# Patient Record
Sex: Female | Born: 1985 | Race: Black or African American | Hispanic: No | Marital: Single | Smoking: Current every day smoker
Health system: Southern US, Community
[De-identification: ages and names within clinical notes are randomized; demographics above are authoritative.]

## PROBLEM LIST (undated history)

## (undated) DIAGNOSIS — R011 Cardiac murmur, unspecified: Secondary | ICD-10-CM

---

## 2017-11-23 ENCOUNTER — Other Ambulatory Visit: Payer: Self-pay

## 2017-11-23 ENCOUNTER — Emergency Department (HOSPITAL_BASED_OUTPATIENT_CLINIC_OR_DEPARTMENT_OTHER): Payer: Medicaid Other

## 2017-11-23 ENCOUNTER — Emergency Department (HOSPITAL_BASED_OUTPATIENT_CLINIC_OR_DEPARTMENT_OTHER)
Admission: EM | Admit: 2017-11-23 | Discharge: 2017-11-23 | Disposition: A | Discharge status: Home / Self Care | Payer: Medicaid Other | Attending: Emergency Medicine | Admitting: Emergency Medicine

## 2017-11-23 ENCOUNTER — Encounter (HOSPITAL_BASED_OUTPATIENT_CLINIC_OR_DEPARTMENT_OTHER): Payer: Self-pay | Admitting: *Deleted

## 2017-11-23 DIAGNOSIS — Z79899 Other long term (current) drug therapy: Secondary | ICD-10-CM | POA: Diagnosis not present

## 2017-11-23 DIAGNOSIS — M25552 Pain in left hip: Secondary | ICD-10-CM | POA: Insufficient documentation

## 2017-11-23 DIAGNOSIS — M791 Myalgia, unspecified site: Secondary | ICD-10-CM | POA: Diagnosis not present

## 2017-11-23 DIAGNOSIS — F17210 Nicotine dependence, cigarettes, uncomplicated: Secondary | ICD-10-CM | POA: Insufficient documentation

## 2017-11-23 LAB — PREGNANCY, URINE: PREG TEST UR: NEGATIVE

## 2017-11-23 MED ORDER — METHOCARBAMOL 500 MG PO TABS: 500.0000 mg | ORAL_TABLET | Freq: Two times a day (BID) | ORAL | 0 refills | Status: DC

## 2017-11-23 MED ORDER — ACETAMINOPHEN 500 MG PO TABS
1000.0000 mg | ORAL_TABLET | Freq: Once | ORAL | Status: AC
  Administered 2017-11-23: 1000 mg via ORAL
  Filled 2017-11-23: qty 2

## 2017-11-23 NOTE — ED Provider Notes (Signed)
[facility] EMERGENCY DEPARTMENT Provider Note   CSN: [id number] Arrival date & time: 11/23/17  0154     History   Chief Complaint Chief Complaint  Patient presents with  . Hip Pain    HPI Sabrina Haynes is a 32 y.o. female.  The history is provided by the patient.  Hip Pain  This is a new problem. The current episode started 12 to 24 hours ago. The problem occurs constantly. The problem has not changed since onset.Pertinent negatives include no chest pain, no abdominal pain, no headaches and no shortness of breath. Nothing aggravates the symptoms. Nothing relieves the symptoms. She has tried nothing for the symptoms. The treatment provided no relief.  Pain in left hip and every muscle of the left side of the body since getting pulled by her small but fast moving dog.    History reviewed. No pertinent past medical history.  There are no active problems to display for this patient.   History reviewed. No pertinent surgical history.  OB History   None      Home Medications    Prior to Admission medications   Medication Sig Start Date End Date Taking? Authorizing Provider  methocarbamol (ROBAXIN) 500 MG tablet Take 1 tablet (500 mg total) by mouth 2 (two) times daily. 11/23/17   Doral Ventrella, MD    Family History History reviewed. No pertinent family history.  Social History Social History   Tobacco Use  . Smoking status: Current Every Day Smoker    Packs/day: 0.50    Types: Cigarettes  Substance Use Topics  . Alcohol use: Yes    Comment: socially  . Drug use: Yes    Frequency: 7.0 times per week    Types: Marijuana     Allergies   Latex   Review of Systems Review of Systems  Constitutional: Negative for fever.  Respiratory: Negative for shortness of breath.   Cardiovascular: Negative for chest pain.  Gastrointestinal: Negative for abdominal pain.  Musculoskeletal: Positive for arthralgias. Negative for gait problem, joint swelling and  neck stiffness.  Neurological: Negative for weakness, numbness and headaches.  All other systems reviewed and are negative.    Physical Exam Updated Vital Signs BP (!) 138/95 (BP Location: Right Arm)   Pulse 77   Temp 98.1 F (36.7 C) (Oral)   Resp 18   Ht 5\' 5"  (1.651 m)   Wt 56.7 kg (125 lb)   SpO2 99%   BMI 20.80 kg/m   Physical Exam  Constitutional: She is oriented to person, place, and time. She appears well-developed and well-nourished. No distress.  HENT:  Head: Normocephalic and atraumatic.  Nose: Nose normal.  Mouth/Throat: No oropharyngeal exudate.  Eyes: Pupils are equal, round, and reactive to light. Conjunctivae are normal.  Neck: Normal range of motion. Neck supple.  Cardiovascular: Normal rate, regular rhythm, normal heart sounds and intact distal pulses.  Pulmonary/Chest: Effort normal and breath sounds normal.  Abdominal: Soft. Bowel sounds are normal. There is no tenderness. There is no guarding.  Musculoskeletal: She exhibits no edema or deformity.       Left shoulder: Normal.       Left elbow: Normal.       Left wrist: Normal.       Left hip: Normal.       Left knee: Normal.       Left ankle: Normal. Achilles tendon normal.       Cervical back: Normal.       Thoracic  back: Normal.       Lumbar back: Normal.       Left hand: Normal. She exhibits normal capillary refill. Normal sensation noted. Normal strength noted.  Neurological: She is alert and oriented to person, place, and time. She displays normal reflexes.  Skin: Skin is warm and dry. Capillary refill takes less than 2 seconds.  Psychiatric: She has a normal mood and affect.     ED Treatments / Results  Labs (all labs ordered are listed, but only abnormal results are displayed)  Results for orders placed or performed during the hospital encounter of 11/23/17  Pregnancy, urine  Result Value Ref Range   Preg Test, Ur NEGATIVE NEGATIVE   Dg Hip Unilat W Or Wo Pelvis 2-3 Views  Left  Result Date: 11/23/2017 CLINICAL DATA:  Left leg pain after dog jerked patient while on a walk. EXAM: DG HIP (WITH OR WITHOUT PELVIS) 2-3V LEFT COMPARISON:  None. FINDINGS: There is no evidence of hip fracture or dislocation. There is no evidence of arthropathy or other focal bone abnormality. Intrauterine device noted in the mid pelvis. Vascular calcifications consistent with phleboliths are present bilaterally. IMPRESSION: No acute osseous abnormality of the pelvis and left hip. Electronically Signed   By: Tollie Ethavid  Kwon M.D.   On: 11/23/2017 03:29    Radiology Dg Hip Unilat W Or Wo Pelvis 2-3 Views Left  Result Date: 11/23/2017 CLINICAL DATA:  Left leg pain after dog jerked patient while on a walk. EXAM: DG HIP (WITH OR WITHOUT PELVIS) 2-3V LEFT COMPARISON:  None. FINDINGS: There is no evidence of hip fracture or dislocation. There is no evidence of arthropathy or other focal bone abnormality. Intrauterine device noted in the mid pelvis. Vascular calcifications consistent with phleboliths are present bilaterally. IMPRESSION: No acute osseous abnormality of the pelvis and left hip. Electronically Signed   By: Tollie Ethavid  Kwon M.D.   On: 11/23/2017 03:29    Procedures Procedures (including critical care time)  Medications Ordered in ED Medications  acetaminophen (TYLENOL) tablet 1,000 mg (1,000 mg Oral Given 11/23/17 0255)       Final Clinical Impressions(s) / ED Diagnoses   Final diagnoses:  Left hip pain  Muscle pain   Return for weakness, numbness, changes in vision or speech, fevers >100.4 unrelieved by medication, shortness of breath, intractable vomiting, or diarrhea, abdominal pain, Inability to tolerate liquids or food, cough, altered mental status or any concerns. No signs of systemic illness or infection. The patient is nontoxic-appearing on exam and vital signs are within normal limits.   I have reviewed the triage vital signs and the nursing notes. Pertinent labs  &imaging results that were available during my care of the patient were reviewed by me and considered in my medical decision making (see chart for details).  After history, exam, and medical workup I feel the patient has been appropriately medically screened and is safe for discharge home. Pertinent diagnoses were discussed with the patient. Patient was given return precautions. ED Discharge Orders        Ordered    methocarbamol (ROBAXIN) 500 MG tablet  2 times daily     11/23/17 0334       Orelia Brandstetter, MD 11/23/17 [phone]

## 2017-11-23 NOTE — ED Notes (Signed)
ED Provider at bedside. 

## 2017-11-23 NOTE — ED Triage Notes (Signed)
States that her dog jerked her today while she was walking him on his leash. Left shoulder pain and left hip pain. Ambulatory.

## 2017-12-09 ENCOUNTER — Emergency Department (HOSPITAL_BASED_OUTPATIENT_CLINIC_OR_DEPARTMENT_OTHER)
Admission: EM | Admit: 2017-12-09 | Discharge: 2017-12-09 | Discharge status: Against Medical Advice | Payer: Medicaid Other | Attending: Emergency Medicine | Admitting: Emergency Medicine

## 2017-12-09 ENCOUNTER — Other Ambulatory Visit: Payer: Self-pay

## 2017-12-09 DIAGNOSIS — R1110 Vomiting, unspecified: Secondary | ICD-10-CM | POA: Diagnosis present

## 2017-12-09 DIAGNOSIS — F17210 Nicotine dependence, cigarettes, uncomplicated: Secondary | ICD-10-CM | POA: Insufficient documentation

## 2017-12-09 DIAGNOSIS — N939 Abnormal uterine and vaginal bleeding, unspecified: Secondary | ICD-10-CM | POA: Diagnosis not present

## 2017-12-09 LAB — URINALYSIS, MICROSCOPIC (REFLEX)

## 2017-12-09 LAB — URINALYSIS, ROUTINE W REFLEX MICROSCOPIC
GLUCOSE, UA: NEGATIVE mg/dL
KETONES UR: 40 mg/dL — AB
Nitrite: NEGATIVE
PH: 6 (ref 5.0–8.0)
Protein, ur: NEGATIVE mg/dL

## 2017-12-09 LAB — PREGNANCY, URINE: Preg Test, Ur: NEGATIVE

## 2017-12-09 NOTE — ED Triage Notes (Signed)
Presents requesting pregnancy test, she believes she is pregnant because she took pregnancy tests that were positive. TOday she threw up in [place]. She is on Mirena and she believes she had the mirena since 2016. SO she does not thisnk she could be pregnant. She is also concerned that she is having withdrawls from not having her zoloft

## 2017-12-09 NOTE — ED Notes (Signed)
EDPA into room for pelvic exam, pt appears to have left, gown on bed, unable to find pt.

## 2017-12-09 NOTE — ED Provider Notes (Signed)
[facility] EMERGENCY DEPARTMENT Provider Note   CSN: [id number] Arrival date & time: 12/09/17  2021     History   Chief Complaint Chief Complaint  Patient presents with  . Emesis    HPI Sabrina Haynes is a 32 y.o. female who presents to ED for evaluation of vomiting, abnormal vaginal bleeding, and possible pregnancy.  Patient has a Mirena IUD in.  She has been experiencing vaginal bleeding intermittently for the past week that has gradually improved.  She also reports 3-4 episodes of NBNB emesis daily for the past 3 weeks.  She is also concerned that she may have genital warts from a new sexual partner, as well as vaginal discharge.  She has had her Mirena IUD in since 2016.  She reports lower abdominal cramping as well.  Denies any hematemesis, constipation, diarrhea, fevers. She would also like a refill of her Zoloft.  She has been out of it for 1 week and states that she has been having some mood swings as a result of it.  She denies any SI, HI, AVH.  She has been on Zoloft since November 2018.  Recently moved into the area and is scheduled for an appointment to establish with a new PCP at the end of the month.  HPI  No past medical history on file.  There are no active problems to display for this patient.   No past surgical history on file.   OB History   None      Home Medications    Prior to Admission medications   Medication Sig Start Date End Date Taking? Authorizing Provider  methocarbamol (ROBAXIN) 500 MG tablet Take 1 tablet (500 mg total) by mouth 2 (two) times daily. 11/23/17   Palumbo, April, MD    Family History No family history on file.  Social History Social History   Tobacco Use  . Smoking status: Current Every Day Smoker    Packs/day: 0.50    Types: Cigarettes  Substance Use Topics  . Alcohol use: Yes    Comment: socially  . Drug use: Yes    Frequency: 7.0 times per week    Types: Marijuana     Allergies   Latex   Review  of Systems Review of Systems  Constitutional: Negative for appetite change, chills and fever.  HENT: Negative for ear pain, rhinorrhea, sneezing and sore throat.   Eyes: Negative for photophobia and visual disturbance.  Respiratory: Negative for cough, chest tightness, shortness of breath and wheezing.   Cardiovascular: Negative for chest pain and palpitations.  Gastrointestinal: Positive for abdominal pain, nausea and vomiting. Negative for blood in stool, constipation and diarrhea.  Genitourinary: Positive for genital sores, vaginal bleeding, vaginal discharge and vaginal pain. Negative for dysuria, hematuria and urgency.  Musculoskeletal: Negative for myalgias.  Skin: Negative for rash.  Neurological: Negative for dizziness, weakness and light-headedness.     Physical Exam Updated Vital Signs BP (!) 123/95 (BP Location: Right Arm)   Pulse 80   Temp 98.7 F (37.1 C) (Oral)   Resp 18   SpO2 98%   Physical Exam  Constitutional: She appears well-developed and well-nourished. No distress.  HENT:  Head: Normocephalic and atraumatic.  Nose: Nose normal.  Eyes: Conjunctivae and EOM are normal. Right eye exhibits no discharge. Left eye exhibits no discharge. No scleral icterus.  Neck: Normal range of motion. Neck supple.  Cardiovascular: Normal rate, regular rhythm, normal heart sounds and intact distal pulses. Exam reveals no gallop and no  friction rub.  No murmur heard. Pulmonary/Chest: Effort normal and breath sounds normal. No respiratory distress.  Abdominal: Soft. Bowel sounds are normal. She exhibits no distension. There is tenderness. There is no rebound and no guarding.    Musculoskeletal: Normal range of motion. She exhibits no edema.  Neurological: She is alert. She exhibits normal muscle tone. Coordination normal.  Skin: Skin is warm and dry. No rash noted.  Psychiatric: She has a normal mood and affect.  Nursing note and vitals reviewed.    ED Treatments / Results    Labs (all labs ordered are listed, but only abnormal results are displayed) Labs Reviewed  URINALYSIS, ROUTINE W REFLEX MICROSCOPIC - Abnormal; Notable for the following components:      Result Value   APPearance CLOUDY (*)    Specific Gravity, Urine >1.030 (*)    Hgb urine dipstick MODERATE (*)    Bilirubin Urine SMALL (*)    Ketones, ur 40 (*)    Leukocytes, UA TRACE (*)    All other components within normal limits  URINALYSIS, MICROSCOPIC (REFLEX) - Abnormal; Notable for the following components:   Bacteria, UA MANY (*)    Squamous Epithelial / LPF 0-5 (*)    All other components within normal limits  WET PREP, GENITAL  PREGNANCY, URINE    EKG None  Radiology No results found.  Procedures Procedures (including critical care time)  Medications Ordered in ED Medications - No data to display   Initial Impression / Assessment and Plan / ED Course  I have reviewed the triage vital signs and the nursing notes.  Pertinent labs & imaging results that were available during my care of the patient were reviewed by me and considered in my medical decision making (see chart for details).     Vision presents to ED for evaluation of abnormal vaginal bleeding, nausea and vomiting for the past several weeks, lower abdominal cramping.  Patient states that she is concerned because she took 2 home pregnancy tests, 1 of which was positive and one was negative.  She has had a Mirena IUD in since 2016.  She is also concerned about her vaginal discharge and possible genital warts.  She was at church today and a church member gave her an antiemetic which improved her vomiting.  She does have some abdominal tenderness to palpation.  Urine pregnancy test here was negative.  Patient eloped prior to pelvic exam or any further treatment.  She did not inform the staff that she would be leaving, so I was unable to complete my evaluation or give any return precautions.  Final Clinical Impressions(s) /  ED Diagnoses   Final diagnoses:  None    ED Discharge Orders    None       Dietrich PatesKhatri, Danyiel Crespin, PA-C 12/09/17 2357    Rolan BuccoBelfi, Melanie, MD 12/10/17 [phone]

## 2017-12-09 NOTE — ED Notes (Signed)
EDPA into room, prior to RN assessment, see PA notes, pending orders.   

## 2018-02-12 ENCOUNTER — Encounter (HOSPITAL_BASED_OUTPATIENT_CLINIC_OR_DEPARTMENT_OTHER): Payer: Self-pay

## 2018-02-12 ENCOUNTER — Other Ambulatory Visit: Payer: Self-pay

## 2018-02-12 ENCOUNTER — Emergency Department (HOSPITAL_BASED_OUTPATIENT_CLINIC_OR_DEPARTMENT_OTHER)
Admission: EM | Admit: 2018-02-12 | Discharge: 2018-02-12 | Disposition: A | Discharge status: Home / Self Care | Payer: Medicaid Other | Attending: Emergency Medicine | Admitting: Emergency Medicine

## 2018-02-12 DIAGNOSIS — Y939 Activity, unspecified: Secondary | ICD-10-CM | POA: Insufficient documentation

## 2018-02-12 DIAGNOSIS — W57XXXA Bitten or stung by nonvenomous insect and other nonvenomous arthropods, initial encounter: Secondary | ICD-10-CM | POA: Diagnosis not present

## 2018-02-12 DIAGNOSIS — F17210 Nicotine dependence, cigarettes, uncomplicated: Secondary | ICD-10-CM | POA: Diagnosis not present

## 2018-02-12 DIAGNOSIS — Z91040 Latex allergy status: Secondary | ICD-10-CM | POA: Insufficient documentation

## 2018-02-12 DIAGNOSIS — Y929 Unspecified place or not applicable: Secondary | ICD-10-CM | POA: Insufficient documentation

## 2018-02-12 DIAGNOSIS — S40861A Insect bite (nonvenomous) of right upper arm, initial encounter: Secondary | ICD-10-CM | POA: Diagnosis not present

## 2018-02-12 DIAGNOSIS — Y998 Other external cause status: Secondary | ICD-10-CM | POA: Diagnosis not present

## 2018-02-12 MED ORDER — DEXAMETHASONE 6 MG PO TABS
10.0000 mg | ORAL_TABLET | Freq: Once | ORAL | Status: AC
  Administered 2018-02-12: 10 mg via ORAL
  Filled 2018-02-12: qty 1

## 2018-02-12 MED ORDER — LORATADINE 10 MG PO TABS: 10.0000 mg | ORAL_TABLET | Freq: Every day | ORAL | 0 refills | Status: DC

## 2018-02-12 MED ORDER — HYDROCORTISONE 1 % EX CREA: TOPICAL_CREAM | CUTANEOUS | 0 refills | Status: DC

## 2018-02-12 NOTE — ED Triage Notes (Signed)
C/o bee sting to right UE-redness noted-pt states she has allergy hx-pt c/o eyes and throat itching-NAD-benadryl 50mg  at 430pm

## 2018-02-12 NOTE — Discharge Instructions (Signed)
Apply ice, take diphenhydramine as needed. Take acetaminophen, ibuprofen or naproxen as needed for pain.

## 2018-02-12 NOTE — ED Provider Notes (Signed)
[facility] EMERGENCY DEPARTMENT Provider Note   CSN: [id number] Arrival date & time: 02/12/18  2110     History   Chief Complaint Chief Complaint  Patient presents with  . Insect Bite    HPI Sabrina Haynes is a 32 y.o. female.  The history is provided by the patient.  She was bitten by a flying insect this morning. Bite was on her right upper arm. The area swelled and has been very itchy. She has taken dihphenhydramine, with temporary relief of itching. She also applied wet tobacco with partial relief. There were no hives, no difficulty breathing or swallowing. She has an Epi-Pen, but did not use it.  History reviewed. No pertinent past medical history.  There are no active problems to display for this patient.   History reviewed. No pertinent surgical history.   OB History   None      Home Medications    Prior to Admission medications   Medication Sig Start Date End Date Taking? Authorizing Provider  methocarbamol (ROBAXIN) 500 MG tablet Take 1 tablet (500 mg total) by mouth 2 (two) times daily. 11/23/17   Palumbo, April, MD    Family History No family history on file.  Social History Social History   Tobacco Use  . Smoking status: Current Every Day Smoker    Packs/day: 0.50    Types: Cigarettes  . Smokeless tobacco: Never Used  Substance Use Topics  . Alcohol use: Yes    Comment: weekly  . Drug use: Yes    Types: Marijuana     Allergies   Latex   Review of Systems Review of Systems  All other systems reviewed and are negative.    Physical Exam Updated Vital Signs BP 116/85 (BP Location: Left Arm)   Pulse 73   Temp 98.5 F (36.9 C) (Oral)   Resp 16   Ht 5\' 5"  (1.651 m)   Wt 57.6 kg (127 lb)   SpO2 100%   BMI 21.13 kg/m   Physical Exam  Nursing note and vitals reviewed.  32 year old female, resting comfortably and in no acute distress. Vital signs are normal. Oxygen saturation is 100%, which is normal. Head is  normocephalic and atraumatic. PERRLA, EOMI. Oropharynx is clear. Neck is nontender and supple without adenopathy or JVD. Back is nontender and there is no CVA tenderness. Lungs are clear without rales, wheezes, or rhonchi. Chest is nontender. Heart has regular rate and rhythm without murmur. Abdomen is soft, flat, nontender without masses or hepatosplenomegaly and peristalsis is normoactive. Extremities have no cyanosis or edema, full range of motion is present. Large wheal present right upper arm, no generalized rash. Skin is warm and dry without other rash. Neurologic: Mental status is normal, cranial nerves are intact, there are no motor or sensory deficits.  ED Treatments / Results   Procedures Procedures   Medications Ordered in ED Medications  dexamethasone (DECADRON) tablet 10 mg (10 mg Oral Given 02/12/18 2346)     Initial Impression / Assessment and Plan / ED Course  I have reviewed the triage vital signs and the nursing notes.  Insect bite rIght arm with local reaction. She is given a dose of dexamethasone, discharged with prescriptions for loaratidine, hydrocortisone cream. Advised to continue taking diphenhydramine as needed.  Final Clinical Impressions(s) / ED Diagnoses   Final diagnoses:  Insect bite of right upper arm, initial encounter    ED Discharge Orders        Ordered  loratadine (CLARITIN) 10 MG tablet  Daily     02/12/18 2347    hydrocortisone cream 1 %     02/12/18 2347       Dione BoozeGlick, Smaran Gaus, MD 02/12/18 2354

## 2018-07-01 ENCOUNTER — Other Ambulatory Visit: Payer: Self-pay

## 2018-07-01 ENCOUNTER — Encounter (HOSPITAL_BASED_OUTPATIENT_CLINIC_OR_DEPARTMENT_OTHER): Payer: Self-pay

## 2018-07-01 ENCOUNTER — Emergency Department (HOSPITAL_BASED_OUTPATIENT_CLINIC_OR_DEPARTMENT_OTHER)
Admission: EM | Admit: 2018-07-01 | Discharge: 2018-07-02 | Disposition: A | Discharge status: Home / Self Care | Payer: Medicaid Other | Attending: Emergency Medicine | Admitting: Emergency Medicine

## 2018-07-01 DIAGNOSIS — F17210 Nicotine dependence, cigarettes, uncomplicated: Secondary | ICD-10-CM | POA: Insufficient documentation

## 2018-07-01 DIAGNOSIS — E860 Dehydration: Secondary | ICD-10-CM | POA: Diagnosis not present

## 2018-07-01 DIAGNOSIS — Z79899 Other long term (current) drug therapy: Secondary | ICD-10-CM | POA: Insufficient documentation

## 2018-07-01 DIAGNOSIS — R197 Diarrhea, unspecified: Secondary | ICD-10-CM | POA: Diagnosis not present

## 2018-07-01 DIAGNOSIS — R112 Nausea with vomiting, unspecified: Secondary | ICD-10-CM | POA: Diagnosis not present

## 2018-07-01 DIAGNOSIS — R1110 Vomiting, unspecified: Secondary | ICD-10-CM | POA: Diagnosis present

## 2018-07-01 HISTORY — DX: Cardiac murmur, unspecified: R01.1

## 2018-07-01 LAB — CBC WITH DIFFERENTIAL/PLATELET
ABS IMMATURE GRANULOCYTES: 0.01 10*3/uL (ref 0.00–0.07)
BASOS ABS: 0.1 10*3/uL (ref 0.0–0.1)
Basophils Relative: 1 %
Eosinophils Absolute: 0.2 10*3/uL (ref 0.0–0.5)
Eosinophils Relative: 2 %
HCT: 40 % (ref 36.0–46.0)
HEMOGLOBIN: 13.1 g/dL (ref 12.0–15.0)
Immature Granulocytes: 0 %
LYMPHS ABS: 3.9 10*3/uL (ref 0.7–4.0)
LYMPHS PCT: 49 %
MCH: 29.7 pg (ref 26.0–34.0)
MCHC: 32.8 g/dL (ref 30.0–36.0)
MCV: 90.7 fL (ref 80.0–100.0)
Monocytes Absolute: 0.5 10*3/uL (ref 0.1–1.0)
Monocytes Relative: 6 %
NEUTROS PCT: 42 %
NRBC: 0 % (ref 0.0–0.2)
Neutro Abs: 3.4 10*3/uL (ref 1.7–7.7)
PLATELETS: 277 10*3/uL (ref 150–400)
RBC: 4.41 MIL/uL (ref 3.87–5.11)
RDW: 13.6 % (ref 11.5–15.5)
WBC: 7.9 10*3/uL (ref 4.0–10.5)

## 2018-07-01 LAB — URINALYSIS, MICROSCOPIC (REFLEX)

## 2018-07-01 LAB — URINALYSIS, ROUTINE W REFLEX MICROSCOPIC
GLUCOSE, UA: NEGATIVE mg/dL
KETONES UR: 15 mg/dL — AB
NITRITE: NEGATIVE
PROTEIN: NEGATIVE mg/dL
Specific Gravity, Urine: >1.03 — ABNORMAL HIGH (ref 1.005–1.030)
pH: 6 (ref 5.0–8.0)

## 2018-07-01 LAB — COMPREHENSIVE METABOLIC PANEL
ALBUMIN: 3.8 g/dL (ref 3.5–5.0)
ALK PHOS: 38 U/L (ref 38–126)
ALT: 12 U/L (ref 0–44)
ANION GAP: 10 (ref 5–15)
AST: 13 U/L — ABNORMAL LOW (ref 15–41)
BUN: 8 mg/dL (ref 6–20)
CALCIUM: 9.2 mg/dL (ref 8.9–10.3)
CHLORIDE: 102 mmol/L (ref 98–111)
CO2: 26 mmol/L (ref 22–32)
Creatinine, Ser: 0.83 mg/dL (ref 0.44–1.00)
GFR calc non Af Amer: >60 mL/min (ref >60)
GLUCOSE: 104 mg/dL — AB (ref 70–99)
Potassium: 3.7 mmol/L (ref 3.5–5.1)
SODIUM: 138 mmol/L (ref 135–145)
Total Bilirubin: 0.6 mg/dL (ref 0.3–1.2)
Total Protein: 7.3 g/dL (ref 6.5–8.1)

## 2018-07-01 LAB — PREGNANCY, URINE: PREG TEST UR: NEGATIVE

## 2018-07-01 MED ORDER — ONDANSETRON HCL 4 MG/2ML IJ SOLN
4.0000 mg | Freq: Once | INTRAMUSCULAR | Status: AC
  Administered 2018-07-02: 4 mg via INTRAVENOUS
  Filled 2018-07-01: qty 2

## 2018-07-01 MED ORDER — SODIUM CHLORIDE 0.9 % IV BOLUS
1000.0000 mL | Freq: Once | INTRAVENOUS | Status: AC
  Administered 2018-07-02: 1000 mL via INTRAVENOUS

## 2018-07-01 MED ORDER — DEXAMETHASONE SODIUM PHOSPHATE 10 MG/ML IJ SOLN
10.0000 mg | Freq: Once | INTRAMUSCULAR | Status: AC
  Administered 2018-07-02: 10 mg via INTRAVENOUS
  Filled 2018-07-01: qty 1

## 2018-07-01 NOTE — ED Notes (Signed)
C/o n/v  Feels like something in throat causing vomiting  Hoarseness,  Slight abd pain x 4 days  Worse today

## 2018-07-01 NOTE — ED Triage Notes (Signed)
Vomiting and reflux for the last few days, with voice hoarseness from vomiting

## 2018-07-01 NOTE — ED Provider Notes (Signed)
[facility] EMERGENCY DEPARTMENT Provider Note   CSN: [id number] Arrival date & time: 07/01/18  2248     History   Chief Complaint Chief Complaint  Patient presents with  . Emesis    HPI Sabrina Haynes is a 32 y.o. female.  The history is provided by the patient.  She has history of a heart murmur, and comes in with vomiting and diarrhea for the last 2 days.  She states that addition to having nausea, she feels like something is choking her and her throat.  She denies fever or chills or sweats.  There has been some mild abdominal soreness.  She denies any sick contacts.  She has not done anything at home to treat her symptoms.  On a couple of occasions, she has seen a couple of small streaks of blood in her emesis.  Past Medical History:  Diagnosis Date  . Heart murmur     There are no active problems to display for this patient.   History reviewed. No pertinent surgical history.   OB History   None      Home Medications    Prior to Admission medications   Medication Sig Start Date End Date Taking? Authorizing Provider  hydrocortisone cream 1 % Apply to affected area 2 times daily 02/12/18   Dione Booze, MD  loratadine (CLARITIN) 10 MG tablet Take 1 tablet (10 mg total) by mouth daily. 02/12/18   Dione Booze, MD  methocarbamol (ROBAXIN) 500 MG tablet Take 1 tablet (500 mg total) by mouth 2 (two) times daily. 11/23/17   Palumbo, April, MD    Family History No family history on file.  Social History Social History   Tobacco Use  . Smoking status: Current Every Day Smoker    Packs/day: 0.50    Types: Cigarettes  . Smokeless tobacco: Never Used  Substance Use Topics  . Alcohol use: Yes    Comment: weekly  . Drug use: Yes    Types: Marijuana     Allergies   Latex   Review of Systems Review of Systems  All other systems reviewed and are negative.    Physical Exam Updated Vital Signs BP (!) 121/93 (BP Location: Right Arm)   Pulse 82    Temp 99 F (37.2 C) (Oral)   Resp 16   Ht 5\' 5"  (1.651 m)   Wt 59 kg   LMP 06/10/2018   SpO2 98%   BMI 21.63 kg/m   Physical Exam  Nursing note and vitals reviewed.  32 year old female, resting comfortably and in no acute distress. Vital signs are significant for borderline elevated diastolic blood pressure. Oxygen saturation is 98%, which is normal. Head is normocephalic and atraumatic. PERRLA, EOMI. Oropharynx is clear.  Mild edema of the uvula is noted.  No pooling of secretions.  Normal phonation. Neck is nontender and supple without adenopathy or JVD. Back is nontender and there is no CVA tenderness. Lungs are clear without rales, wheezes, or rhonchi. Chest is nontender. Heart has regular rate and rhythm without murmur. Abdomen is soft, flat, nontender without masses or hepatosplenomegaly and peristalsis is hypoactive. Extremities have no cyanosis or edema, full range of motion is present. Skin is warm and dry without rash. Neurologic: Mental status is normal, cranial nerves are intact, there are no motor or sensory deficits.  ED Treatments / Results  Labs (all labs ordered are listed, but only abnormal results are displayed) Labs Reviewed  URINALYSIS, ROUTINE W REFLEX MICROSCOPIC - Abnormal;  Notable for the following components:      Result Value   APPearance CLOUDY (*)    Specific Gravity, Urine >1.030 (*)    Hgb urine dipstick TRACE (*)    Bilirubin Urine SMALL (*)    Ketones, ur 15 (*)    Leukocytes, UA TRACE (*)    All other components within normal limits  COMPREHENSIVE METABOLIC PANEL - Abnormal; Notable for the following components:   Glucose, Bld 104 (*)    AST 13 (*)    All other components within normal limits  URINALYSIS, MICROSCOPIC (REFLEX) - Abnormal; Notable for the following components:   Bacteria, UA RARE (*)    All other components within normal limits  PREGNANCY, URINE  CBC WITH DIFFERENTIAL/PLATELET   Procedures Procedures   Medications  Ordered in ED Medications  sodium chloride 0.9 % bolus 1,000 mL (has no administration in time range)  ondansetron (ZOFRAN) injection 4 mg (has no administration in time range)  dexamethasone (DECADRON) injection 10 mg (has no administration in time range)     Initial Impression / Assessment and Plan / ED Course  I have reviewed the triage vital signs and the nursing notes.  Pertinent lab results that were available during my care of the patient were reviewed by me and considered in my medical decision making (see chart for details).  Nausea, vomiting, diarrhea and pattern suggestive of viral gastroenteritis.  No red flags to suggest more serious pathology.  Choking sensation appears to be related to edema of the uvula.  This is likely related to multiple episodes of emesis.  She will be given IV fluids and ondansetron for nausea.  Also given a dose of dexamethasone.  Laboratory testing shows normal renal function and electrolytes, normal CBC.  Urinalysis has high specific gravity consistent with dehydration, ketones also noted to be present.  Old records are reviewed, and she has no relevant past visits.  She feels much better after above-noted treatment.  She is discharged with prescription for ondansetron, told to use over-the-counter loperamide as needed for diarrhea.  Final Clinical Impressions(s) / ED Diagnoses   Final diagnoses:  Nausea vomiting and diarrhea    ED Discharge Orders         Ordered    ondansetron (ZOFRAN) 4 MG tablet  Every 6 hours PRN     07/02/18 0130           Dione Booze, MD 07/02/18 [phone]

## 2018-07-02 MED ORDER — ONDANSETRON HCL 4 MG PO TABS: 4.0000 mg | ORAL_TABLET | Freq: Four times a day (QID) | ORAL | 0 refills | Status: DC | PRN

## 2018-07-02 NOTE — Discharge Instructions (Signed)
Take loperamide (Imodium A-D) as needed for diarrhea. ?

## 2019-03-15 ENCOUNTER — Encounter (HOSPITAL_BASED_OUTPATIENT_CLINIC_OR_DEPARTMENT_OTHER): Payer: Self-pay | Admitting: Emergency Medicine

## 2019-03-15 ENCOUNTER — Emergency Department (HOSPITAL_BASED_OUTPATIENT_CLINIC_OR_DEPARTMENT_OTHER)
Admission: EM | Admit: 2019-03-15 | Discharge: 2019-03-15 | Disposition: A | Discharge status: Home / Self Care | Payer: Medicaid Other | Attending: Emergency Medicine | Admitting: Emergency Medicine

## 2019-03-15 ENCOUNTER — Other Ambulatory Visit: Payer: Self-pay

## 2019-03-15 DIAGNOSIS — F17210 Nicotine dependence, cigarettes, uncomplicated: Secondary | ICD-10-CM | POA: Diagnosis not present

## 2019-03-15 DIAGNOSIS — R5383 Other fatigue: Secondary | ICD-10-CM | POA: Insufficient documentation

## 2019-03-15 DIAGNOSIS — Z79899 Other long term (current) drug therapy: Secondary | ICD-10-CM | POA: Diagnosis not present

## 2019-03-15 DIAGNOSIS — N3000 Acute cystitis without hematuria: Secondary | ICD-10-CM | POA: Diagnosis not present

## 2019-03-15 LAB — URINALYSIS, ROUTINE W REFLEX MICROSCOPIC
Bilirubin Urine: NEGATIVE
Glucose, UA: NEGATIVE mg/dL
Ketones, ur: NEGATIVE mg/dL
Nitrite: NEGATIVE
Protein, ur: NEGATIVE mg/dL
Specific Gravity, Urine: <1.005 — ABNORMAL LOW (ref 1.005–1.030)
pH: 7.5 (ref 5.0–8.0)

## 2019-03-15 LAB — BASIC METABOLIC PANEL
Anion gap: 9 (ref 5–15)
BUN: 7 mg/dL (ref 6–20)
CO2: 26 mmol/L (ref 22–32)
Calcium: 9.2 mg/dL (ref 8.9–10.3)
Chloride: 102 mmol/L (ref 98–111)
Creatinine, Ser: 0.67 mg/dL (ref 0.44–1.00)
GFR calc Af Amer: >60 mL/min (ref >60)
GFR calc non Af Amer: >60 mL/min (ref >60)
Glucose, Bld: 86 mg/dL (ref 70–99)
Potassium: 3.6 mmol/L (ref 3.5–5.1)
Sodium: 137 mmol/L (ref 135–145)

## 2019-03-15 LAB — CBC WITH DIFFERENTIAL/PLATELET
Abs Immature Granulocytes: 0.01 10*3/uL (ref 0.00–0.07)
Basophils Absolute: 0 10*3/uL (ref 0.0–0.1)
Basophils Relative: 1 %
Eosinophils Absolute: 0.2 10*3/uL (ref 0.0–0.5)
Eosinophils Relative: 3 %
HCT: 42.8 % (ref 36.0–46.0)
Hemoglobin: 13.8 g/dL (ref 12.0–15.0)
Immature Granulocytes: 0 %
Lymphocytes Relative: 43 %
Lymphs Abs: 2.9 10*3/uL (ref 0.7–4.0)
MCH: 29.4 pg (ref 26.0–34.0)
MCHC: 32.2 g/dL (ref 30.0–36.0)
MCV: 91.3 fL (ref 80.0–100.0)
Monocytes Absolute: 0.6 10*3/uL (ref 0.1–1.0)
Monocytes Relative: 9 %
Neutro Abs: 3 10*3/uL (ref 1.7–7.7)
Neutrophils Relative %: 44 %
Platelets: 360 10*3/uL (ref 150–400)
RBC: 4.69 MIL/uL (ref 3.87–5.11)
RDW: 13.5 % (ref 11.5–15.5)
WBC: 6.7 10*3/uL (ref 4.0–10.5)
nRBC: 0 % (ref 0.0–0.2)

## 2019-03-15 LAB — PREGNANCY, URINE: Preg Test, Ur: NEGATIVE

## 2019-03-15 LAB — URINALYSIS, MICROSCOPIC (REFLEX): RBC / HPF: NONE SEEN RBC/hpf (ref 0–5)

## 2019-03-15 LAB — TSH: TSH: 1.788 u[IU]/mL (ref 0.350–4.500)

## 2019-03-15 MED ORDER — NITROFURANTOIN MONOHYD MACRO 100 MG PO CAPS: 100.0000 mg | ORAL_CAPSULE | Freq: Two times a day (BID) | ORAL | 0 refills | Status: DC

## 2019-03-15 NOTE — ED Provider Notes (Signed)
[facility] EMERGENCY DEPARTMENT Provider Note   CSN: [id number] Arrival date & time: 03/15/19  1433     History   Chief Complaint Chief Complaint  Patient presents with  . Possible Pregnancy    HPI Sabrina Haynes is a 33 y.o. female.     Patient with history of anemia presents the emergency department with worsening fatigue, nausea, headaches over the past 2 weeks.  She has also had breast tenderness over the past 2 months.  No skin changes but she states that her breasts "feel like rocks".  She reports approximately 1 month of weight gain.  No heat or cold intolerance.  She has been constipated.  No vomiting or diarrhea.  No history of thyroid issues.  She was concerned initially that she is pregnant.  She states that typically urine pregnancy tests are incorrect.  No vision changes or vision loss.  Onset of symptoms acute.  Course is constant.     Past Medical History:  Diagnosis Date  . Heart murmur     There are no active problems to display for this patient.   History reviewed. No pertinent surgical history.   OB History   No obstetric history on file.      Home Medications    Prior to Admission medications   Medication Sig Start Date End Date Taking? Authorizing Provider  hydrocortisone cream 1 % Apply to affected area 2 times daily 4/74/25   Delora Fuel, MD  loratadine (CLARITIN) 10 MG tablet Take 1 tablet (10 mg total) by mouth daily. 9/56/38   Delora Fuel, MD  ondansetron (ZOFRAN) 4 MG tablet Take 1 tablet (4 mg total) by mouth every 6 (six) hours as needed for nausea or vomiting. 75/64/33   Delora Fuel, MD  sertraline (ZOLOFT) 50 MG tablet TK 1 T PO ONCE D 12/06/18   [provider]    Family History No family history on file.  Social History Social History   Tobacco Use  . Smoking status: Current Every Day Smoker    Packs/day: 0.50    Types: Cigarettes  . Smokeless tobacco: Never Used  Substance Use Topics  . Alcohol use:  Yes    Comment: weekly  . Drug use: Yes    Types: Marijuana     Allergies   Latex   Review of Systems Review of Systems  Constitutional: Positive for fatigue and unexpected weight change. Negative for fever.  HENT: Negative for rhinorrhea and sore throat.   Eyes: Negative for redness and visual disturbance.  Respiratory: Negative for cough.   Cardiovascular: Negative for chest pain.  Gastrointestinal: Positive for nausea. Negative for abdominal pain, diarrhea and vomiting.  Genitourinary: Positive for frequency. Negative for dysuria.  Musculoskeletal: Negative for myalgias.  Skin: Negative for rash.  Neurological: Positive for headaches.     Physical Exam Updated Vital Signs BP (!) 114/92 (BP Location: Right Arm)   Pulse 81   Temp 99 F (37.2 C) (Oral)   Resp 18   Ht 5\' 5"  (1.651 m)   Wt 68 kg   SpO2 100%   BMI 24.96 kg/m   Physical Exam Vitals signs and nursing note reviewed.  Constitutional:      Appearance: She is well-developed.  HENT:     Head: Normocephalic and atraumatic.     Mouth/Throat:     Mouth: Mucous membranes are moist.  Eyes:     General:        Right eye: No discharge.  Left eye: No discharge.     Conjunctiva/sclera: Conjunctivae normal.  Neck:     Musculoskeletal: Normal range of motion and neck supple.     Comments: No thyroid nodules palpated. No significant goiter.  Cardiovascular:     Rate and Rhythm: Normal rate and regular rhythm.     Heart sounds: Normal heart sounds.  Pulmonary:     Effort: Pulmonary effort is normal.     Breath sounds: Normal breath sounds.  Abdominal:     Palpations: Abdomen is soft.     Tenderness: There is no abdominal tenderness.  Musculoskeletal:        General: No swelling or tenderness.     Right lower leg: No edema.     Left lower leg: No edema.  Skin:    General: Skin is warm and dry.  Neurological:     Mental Status: She is alert.      ED Treatments / Results  Labs (all labs  ordered are listed, but only abnormal results are displayed) Labs Reviewed  URINALYSIS, ROUTINE W REFLEX MICROSCOPIC - Abnormal; Notable for the following components:      Result Value   Color, Urine STRAW (*)    Specific Gravity, Urine <1.005 (*)    Hgb urine dipstick TRACE (*)    Leukocytes,Ua SMALL (*)    All other components within normal limits  URINALYSIS, MICROSCOPIC (REFLEX) - Abnormal; Notable for the following components:   Bacteria, UA MANY (*)    All other components within normal limits  URINE CULTURE  PREGNANCY, URINE  CBC WITH DIFFERENTIAL/PLATELET  BASIC METABOLIC PANEL  TSH    EKG None  Radiology No results found.  Procedures Procedures (including critical care time)  Medications Ordered in ED Medications - No data to display   Initial Impression / Assessment and Plan / ED Course  I have reviewed the triage vital signs and the nursing notes.  Pertinent labs & imaging results that were available during my care of the patient were reviewed by me and considered in my medical decision making (see chart for details).        Patient seen and examined. Work-up initiated. Medications ordered.   Vital signs reviewed and are as follows: BP (!) 114/92 (BP Location: Right Arm)   Pulse 81   Temp 99 F (37.2 C) (Oral)   Resp 18   Ht 5\' 5"  (1.651 m)   Wt 68 kg   SpO2 100%   BMI 24.96 kg/m   5:48 PM work-up largely reassuring to this point.  Patient with UA with elevated white blood cells and bacteria however not a clean-catch.  Culture sent.  Given complaint of urinary frequency, will give 5-day course of Macrobid to treat possible UTI.  TSH is pending.  I will follow-up and call patient if there are any results that need to be addressed.  Otherwise, patient encouraged to follow with her primary care provider this coming week for further evaluation.  Final Clinical Impressions(s) / ED Diagnoses   Final diagnoses:  Fatigue, unspecified type  Acute  cystitis without hematuria   Patient with multiple complaints today.  Work-up is largely reassuring.  TSH pending.  UA with possible UTI, treatment with Macrobid.  Negative pregnancy.   ED Discharge Orders         Ordered    nitrofurantoin, macrocrystal-monohydrate, (MACROBID) 100 MG capsule  2 times daily     03/15/19 1745           Caterra Ostroff,  Glo HerringJoshua, PA-C 03/15/19 1749    Geoffery Lyonselo, Douglas, MD 03/15/19 Windell [id number]

## 2019-03-15 NOTE — ED Notes (Signed)
Pt uses depo and last shot in December. Pt states she had intercourse in May. Pt states prior miscarriage with similar symptoms. Pt c/o breast tenderness x 2 months and pelvic pain/cramping x 2 weeks. Pt also c/o constipation. Also c/o N/V. Pt not currently getting menstrual cycle pt states d/t depo.

## 2019-03-15 NOTE — Discharge Instructions (Signed)
Please read and follow all provided instructions.  Your diagnoses today include:  1. Fatigue, unspecified type   2. Acute cystitis without hematuria     Tests performed today include:  Blood counts and electrolytes -no anemia  Urine test - suggests infection  Thyroid test or TSH - pending  Vital signs. See below for your results today.   Medications prescribed:   Macrobid - antibiotic for urinary tract infection  You have been prescribed an antibiotic medicine: take the entire course of medicine even if you are feeling better. Stopping early can cause the antibiotic not to work.  Take any prescribed medications only as directed.  Home care instructions:  Follow any educational materials contained in this packet.  Follow-up instructions: Please follow-up with your primary care provider in the next 5 days for further evaluation of your symptoms.   Return instructions:   Please return to the Emergency Department if you experience worsening symptoms.   Please return if you have any other emergent concerns.  Additional Information:  Your vital signs today were: BP 115/71 (BP Location: Left Arm)    Pulse 75    Temp 99 F (37.2 C) (Oral)    Resp 20    Ht 5\' 5"  (1.651 m)    Wt 68 kg    SpO2 100%    BMI 24.96 kg/m  If your blood pressure (BP) was elevated above 135/85 this visit, please have this repeated by your doctor within one month. --------------

## 2019-03-15 NOTE — ED Triage Notes (Signed)
Pt states she thinks she is pregnant. She is tired and states she is anemic. Neg preg test at home.

## 2019-03-17 ENCOUNTER — Telehealth (HOSPITAL_BASED_OUTPATIENT_CLINIC_OR_DEPARTMENT_OTHER): Payer: Self-pay | Admitting: Emergency Medicine

## 2019-03-17 ENCOUNTER — Emergency Department (HOSPITAL_BASED_OUTPATIENT_CLINIC_OR_DEPARTMENT_OTHER)
Admission: EM | Admit: 2019-03-17 | Discharge: 2019-03-17 | Disposition: A | Discharge status: Home / Self Care | Payer: Medicaid Other | Attending: Emergency Medicine | Admitting: Emergency Medicine

## 2019-03-17 ENCOUNTER — Encounter (HOSPITAL_BASED_OUTPATIENT_CLINIC_OR_DEPARTMENT_OTHER): Payer: Self-pay | Admitting: *Deleted

## 2019-03-17 ENCOUNTER — Other Ambulatory Visit: Payer: Self-pay

## 2019-03-17 DIAGNOSIS — Z79899 Other long term (current) drug therapy: Secondary | ICD-10-CM | POA: Insufficient documentation

## 2019-03-17 DIAGNOSIS — Z3202 Encounter for pregnancy test, result negative: Secondary | ICD-10-CM | POA: Insufficient documentation

## 2019-03-17 DIAGNOSIS — F17210 Nicotine dependence, cigarettes, uncomplicated: Secondary | ICD-10-CM | POA: Diagnosis not present

## 2019-03-17 LAB — URINE CULTURE: Culture: <10000 — AB

## 2019-03-17 LAB — HCG, SERUM, QUALITATIVE: Preg, Serum: NEGATIVE

## 2019-03-17 NOTE — ED Provider Notes (Signed)
[facility] EMERGENCY DEPARTMENT Provider Note   CSN: [id number] Arrival date & time: 03/17/19  1246     History   Chief Complaint Chief Complaint  Patient presents with  . Possible Pregnancy    HPI Sabrina Haynes is a 33 y.o. female with a hx of tobacco abuse who returns to the ED requesting pregnancy test via blood draw. Seen in the ED 2 days prior with multitude of sxs including fatigue, weight gain, nausea, & breast tenderness (w/o overlying skin changes or drainage). Had urine pregnancy test that was negative as well as other blood work. No acute change in her sxs. No alleviating/aggravating factors.  She returns today as she is requesting a serum pregnancy test as she has had negative urine test with positive blood test in the past.  She denies abdominal pain, vaginal bleeding, vaginal discharge, vomiting, fever, or chills.  She has had 4 prior pregnancies.  Unknown last menstrual period.  Was previously on Depo but is not anymore.      HPI  Past Medical History:  Diagnosis Date  . Heart murmur     There are no active problems to display for this patient.   History reviewed. No pertinent surgical history.   OB History   No obstetric history on file.      Home Medications    Prior to Admission medications   Medication Sig Start Date End Date Taking? Authorizing Provider  sertraline (ZOLOFT) 50 MG tablet TK 1 T PO ONCE D 12/06/18  Yes [provider]  hydrocortisone cream 1 % Apply to affected area 2 times daily 02/12/18   Dione BoozeGlick, David, MD  loratadine (CLARITIN) 10 MG tablet Take 1 tablet (10 mg total) by mouth daily. 02/12/18   Dione BoozeGlick, David, MD  nitrofurantoin, macrocrystal-monohydrate, (MACROBID) 100 MG capsule Take 1 capsule (100 mg total) by mouth 2 (two) times daily. 03/15/19   Renne CriglerGeiple, Joshua, PA-C  ondansetron (ZOFRAN) 4 MG tablet Take 1 tablet (4 mg total) by mouth every 6 (six) hours as needed for nausea or vomiting. 07/02/18   Dione BoozeGlick, David, MD     Family History No family history on file.  Social History Social History   Tobacco Use  . Smoking status: Current Every Day Smoker    Packs/day: 0.50    Types: Cigarettes  . Smokeless tobacco: Never Used  Substance Use Topics  . Alcohol use: Yes    Comment: weekly  . Drug use: Yes    Types: Marijuana     Allergies   Latex   Review of Systems Review of Systems  Constitutional: Positive for fatigue and unexpected weight change. Negative for chills and fever.  Respiratory: Negative for shortness of breath.   Cardiovascular: Negative for chest pain.  Gastrointestinal: Positive for nausea. Negative for abdominal pain and vomiting.  Genitourinary: Negative for dysuria, pelvic pain, vaginal bleeding and vaginal discharge.  Neurological: Negative for syncope.     Physical Exam Updated Vital Signs BP (!) 144/87 (BP Location: Right Arm)   Pulse 80   Temp 98.2 F (36.8 C) (Oral)   Resp 14   Ht 5\' 5"  (1.651 m)   Wt 70.5 kg   SpO2 100%   BMI 25.86 kg/m   Physical Exam Vitals signs and nursing note reviewed.  Constitutional:      General: She is not in acute distress.    Appearance: She is well-developed. She is not toxic-appearing.  HENT:     Head: Normocephalic and atraumatic.  Eyes:  General:        Right eye: No discharge.        Left eye: No discharge.     Conjunctiva/sclera: Conjunctivae normal.  Neck:     Musculoskeletal: Neck supple.  Cardiovascular:     Rate and Rhythm: Normal rate and regular rhythm.  Pulmonary:     Effort: Pulmonary effort is normal. No respiratory distress.     Breath sounds: Normal breath sounds. No wheezing, rhonchi or rales.  Chest:     Comments: Patient declined breast exam.  Abdominal:     General: There is no distension.     Palpations: Abdomen is soft.     Tenderness: There is no abdominal tenderness. There is no guarding or rebound.  Skin:    General: Skin is warm and dry.     Findings: No rash.  Neurological:      Mental Status: She is alert.     Comments: Clear speech.   Psychiatric:        Behavior: Behavior normal.      ED Treatments / Results  Labs (all labs ordered are listed, but only abnormal results are displayed) Labs Reviewed - No data to display  EKG None  Radiology No results found.  Procedures Procedures (including critical care time)  Medications Ordered in ED Medications - No data to display   Initial Impression / Assessment and Plan / ED Course  I have reviewed the triage vital signs and the nursing notes.  Pertinent labs & imaging results that were available during my care of the patient were reviewed by me and considered in my medical decision making (see chart for details).   Patient returns to the ER requesting blood work pregnancy testing.  Seen in the ED 2 days prior for her sxs as discussed above.  Visit reviewed: Labs w/o leukocytosis, anemia, significant electrolyte derangement, UTI, or thyroid dysfunction. Urine preg test negative.  Qualitative hCG negative in the ED.  Overall reassuring ER work up from last visit.  PCP follow up.  I discussed results, treatment plan, need for follow-up, and return precautions with the patient. Provided opportunity for questions, patient confirmed understanding and is in agreement with plan.    Final Clinical Impressions(s) / ED Diagnoses   Final diagnoses:  Negative pregnancy test    ED Discharge Orders    None       Amaryllis Dyke, PA-C 03/17/19 1414    Maudie Flakes, MD 03/18/19 [phone]

## 2019-03-17 NOTE — ED Triage Notes (Signed)
She states she was seen here 2 days ago for possible pregnancy. States she had a negative urine test and wants to have a blood test for pregnancy today. She has weight gain, breast tenderness and nausea.

## 2019-04-02 ENCOUNTER — Encounter (HOSPITAL_BASED_OUTPATIENT_CLINIC_OR_DEPARTMENT_OTHER): Payer: Self-pay

## 2019-04-02 ENCOUNTER — Other Ambulatory Visit: Payer: Self-pay

## 2019-04-02 ENCOUNTER — Emergency Department (HOSPITAL_BASED_OUTPATIENT_CLINIC_OR_DEPARTMENT_OTHER)
Admission: EM | Admit: 2019-04-02 | Discharge: 2019-04-03 | Disposition: A | Discharge status: Home / Self Care | Payer: Medicaid Other | Attending: Emergency Medicine | Admitting: Emergency Medicine

## 2019-04-02 DIAGNOSIS — F17210 Nicotine dependence, cigarettes, uncomplicated: Secondary | ICD-10-CM | POA: Insufficient documentation

## 2019-04-02 DIAGNOSIS — K219 Gastro-esophageal reflux disease without esophagitis: Secondary | ICD-10-CM | POA: Insufficient documentation

## 2019-04-02 DIAGNOSIS — Z79899 Other long term (current) drug therapy: Secondary | ICD-10-CM | POA: Insufficient documentation

## 2019-04-02 DIAGNOSIS — Z711 Person with feared health complaint in whom no diagnosis is made: Secondary | ICD-10-CM

## 2019-04-02 DIAGNOSIS — R079 Chest pain, unspecified: Secondary | ICD-10-CM | POA: Diagnosis present

## 2019-04-02 NOTE — ED Notes (Signed)
Pt with multiple complaints. States that she has acid reflux that is worse after chewing gum and drinking soda. States that she thinks that she is pregnant-LMP 2 weeks ago-states that she had consistent periods with her previous pregnancies. Pt has had multiple negative pregnancy tests. Pt also reports that she has had HA and sinus pressure also.

## 2019-04-02 NOTE — ED Triage Notes (Addendum)
C/o CP x 1 week-also states she feels she is pregnant even though she had neg preg test-pt with constant belching during triage-NAD-steady gait

## 2019-04-03 ENCOUNTER — Encounter (HOSPITAL_BASED_OUTPATIENT_CLINIC_OR_DEPARTMENT_OTHER): Payer: Self-pay

## 2019-04-03 ENCOUNTER — Ambulatory Visit (HOSPITAL_BASED_OUTPATIENT_CLINIC_OR_DEPARTMENT_OTHER)
Admission: RE | Admit: 2019-04-03 | Discharge: 2019-04-03 | Disposition: A | Discharge status: Home / Self Care | Payer: Medicaid Other | Source: Ambulatory Visit | Attending: Emergency Medicine | Admitting: Emergency Medicine

## 2019-04-03 DIAGNOSIS — R1032 Left lower quadrant pain: Secondary | ICD-10-CM | POA: Diagnosis present

## 2019-04-03 MED ORDER — LANSOPRAZOLE 30 MG PO CPDR: 30.0000 mg | DELAYED_RELEASE_CAPSULE | Freq: Every day | ORAL | 0 refills | Status: DC

## 2019-04-03 NOTE — ED Notes (Signed)
Pt reports that she does not want her BP rechecked prior to leaving.

## 2019-04-03 NOTE — ED Notes (Signed)
[place] results given to pt , f/u with GYN

## 2019-04-03 NOTE — ED Provider Notes (Signed)
[facility] Provider Note: Lowella DellJ. Lane Delrico Minehart, MD, FACEP  CSN: [id number] MRN: [id number] ARRIVAL: 04/02/19 at 2224 ROOM: [facility]03/[facility]   CHIEF COMPLAINT  Chest Pain   HISTORY OF PRESENT ILLNESS  04/03/19 12:45 AM Sabrina Haynes is a 33 y.o. female who complains of a one-week history of acid reflux with a burning discomfort in her chest which she rates as a 2 out of 10.  She has not been taking anything for this except Tylenol.  She believes that she is pregnant despite a negative pregnancy test on the 13th of this month.  She believes she is pregnant because her abdomen feels firm and she has some vaguely described systemic symptoms that remind her of previous pregnancy.    Past Medical History:  Diagnosis Date  . Heart murmur     History reviewed. No pertinent surgical history.  No family history on file.  Social History   Tobacco Use  . Smoking status: Current Every Day Smoker    Packs/day: 0.50    Types: Cigarettes  . Smokeless tobacco: Never Used  Substance Use Topics  . Alcohol use: Yes    Comment: weekly  . Drug use: Yes    Types: Marijuana    Prior to Admission medications   Medication Sig Start Date End Date Taking? Authorizing Provider  hydrocortisone cream 1 % Apply to affected area 2 times daily 02/12/18   Dione BoozeGlick, David, MD  loratadine (CLARITIN) 10 MG tablet Take 1 tablet (10 mg total) by mouth daily. 02/12/18   Dione BoozeGlick, David, MD  nitrofurantoin, macrocrystal-monohydrate, (MACROBID) 100 MG capsule Take 1 capsule (100 mg total) by mouth 2 (two) times daily. 03/15/19   Renne CriglerGeiple, Joshua, PA-C  ondansetron (ZOFRAN) 4 MG tablet Take 1 tablet (4 mg total) by mouth every 6 (six) hours as needed for nausea or vomiting. 07/02/18   Dione BoozeGlick, David, MD  sertraline (ZOLOFT) 50 MG tablet TK 1 T PO ONCE D 12/06/18   [provider]    Allergies Latex   REVIEW OF SYSTEMS  Negative except as noted here or in the History of Present Illness.   PHYSICAL  EXAMINATION  Initial Vital Signs Blood pressure (!) 139/95, pulse 77, temperature 98.8 F (37.1 C), temperature source Oral, resp. rate 16, height 5\' 5"  (1.651 m), weight 70.3 kg, last menstrual period 03/19/2019, SpO2 99 %.  Examination General: Well-developed, well-nourished female in no acute distress; appearance consistent with age of record HENT: normocephalic; atraumatic Eyes: pupils equal, round and reactive to light; extraocular muscles intact Neck: supple Heart: regular rate and rhythm Lungs: clear to auscultation bilaterally Abdomen: soft; nondistended; nontender; no masses or hepatosplenomegaly; bowel sounds present; no pregnancy seen on bedside ultrasound Extremities: No deformity; full range of motion; pulses normal Neurologic: Awake, alert and oriented; motor function intact in all extremities and symmetric; no facial droop Skin: Warm and dry Psychiatric: Normal mood and affect   RESULTS  Summary of this visit's results, reviewed by myself:   EKG Interpretation  Date/Time:  Wednesday April 02 2019 22:32:00 EDT Ventricular Rate:  77 PR Interval:  174 QRS Duration: 76 QT Interval:  336 QTC Calculation: 380 R Axis:   47 Text Interpretation:  Normal sinus rhythm No previous ECGs available Artifact Confirmed by Paula LibraMolpus, Eh Sesay ([id number]) on 04/02/2019 10:50:16 PM      Laboratory Studies: No results found for this or any previous visit (from the past 24 hour(s)). Imaging Studies: No results found.  ED COURSE and MDM  Nursing notes and initial  vitals signs, including pulse oximetry, reviewed.  Vitals:   04/02/19 2232  BP: (!) 139/95  Pulse: 77  Resp: 16  Temp: 98.8 F (37.1 C)  TempSrc: Oral  SpO2: 99%  Weight: 70.3 kg  Height: 5\' 5"  (1.651 m)   The patient states she was pregnant 13 years ago with a negative pregnancy test and believes she is pregnant again.  We will have her return for a formal transvaginal ultrasound for definitive evaluation.  She supposedly  had a menstrual period on the 15th of this month.  This could represent pseudocyesis and the patient admits that this is a possibility.  We will start her on Prevacid as this is known to be safe in pregnancy.  PROCEDURES    ED DIAGNOSES     ICD-10-CM   1. Gastroesophageal reflux disease without esophagitis  K21.9   2. Concern about unplanned pregnancy without diagnosis  Z71.1        Theran Vandergrift, MD 04/03/19 [phone]

## 2019-11-03 IMAGING — DX DG HIP (WITH OR WITHOUT PELVIS) 2-3V*L*
3 series · 3 of 3 positions shown · non-contrast
Comparison: None.

CLINICAL DATA: Left leg pain after dog jerked patient while on a
walk.

EXAM:
DG HIP (WITH OR WITHOUT PELVIS) 2-3V LEFT

[pelvis ap]
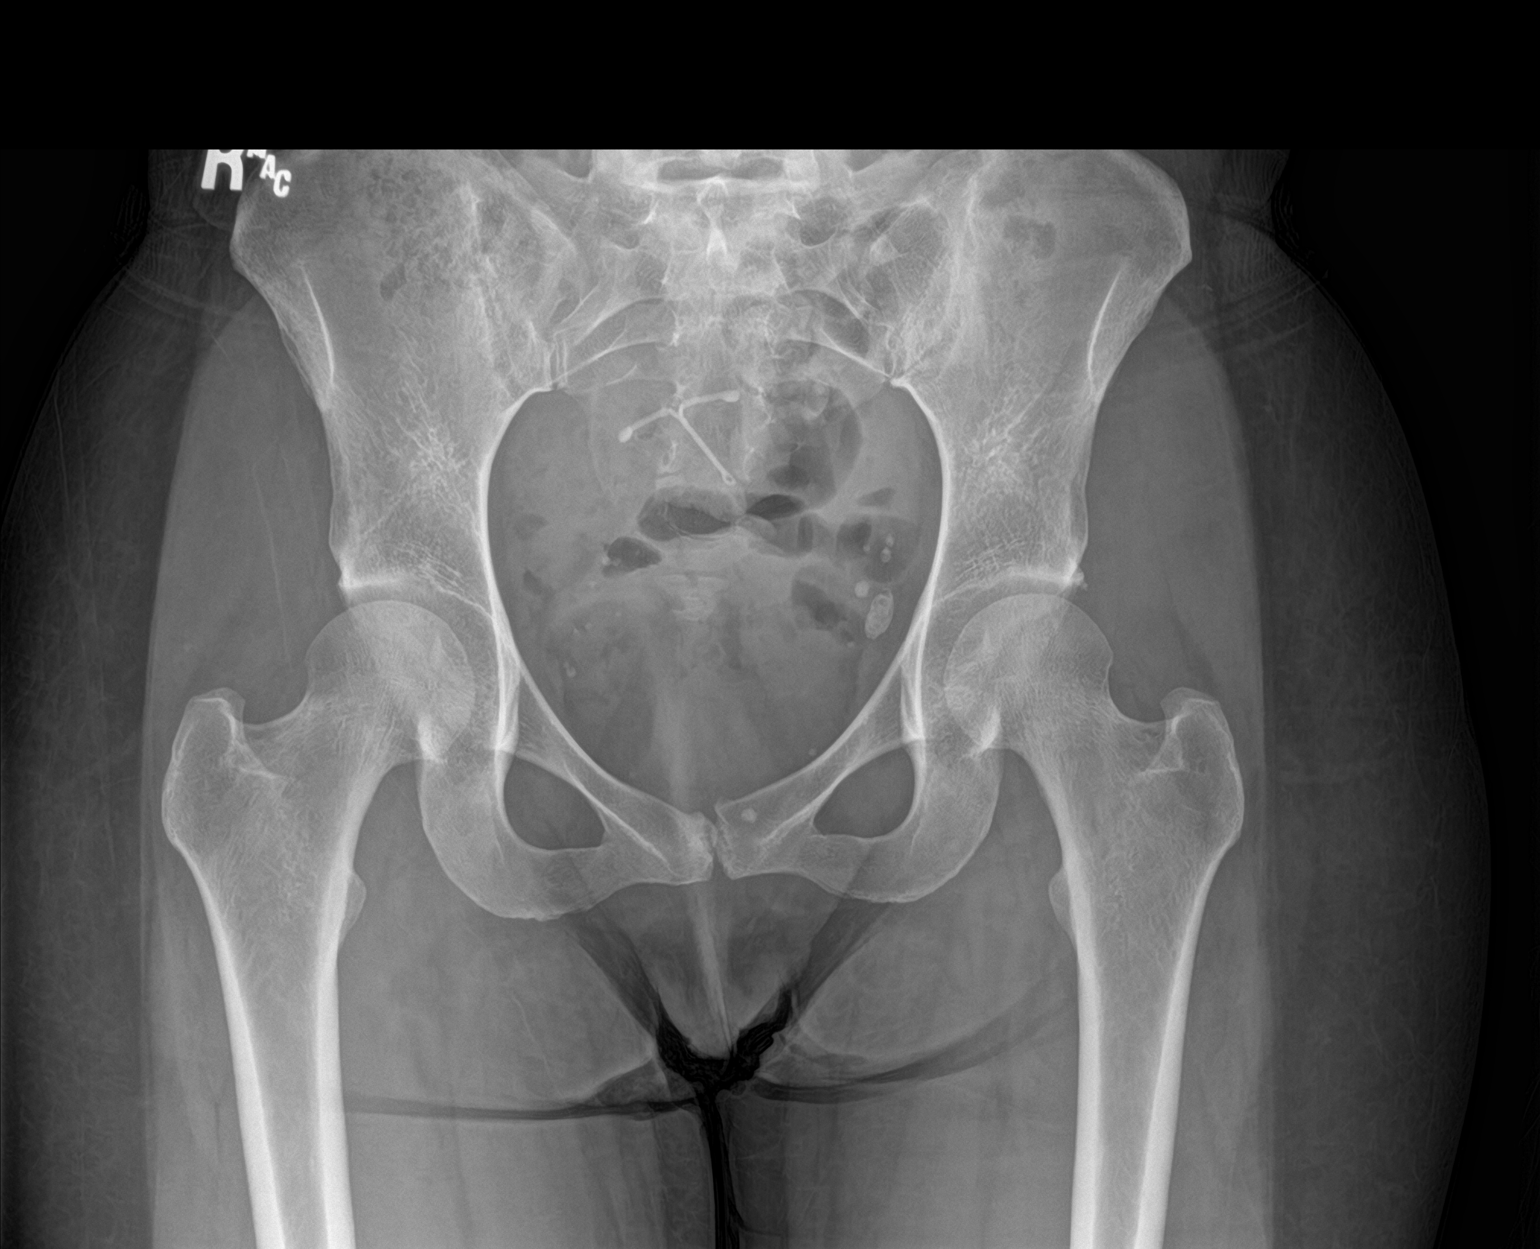

[hip ap]
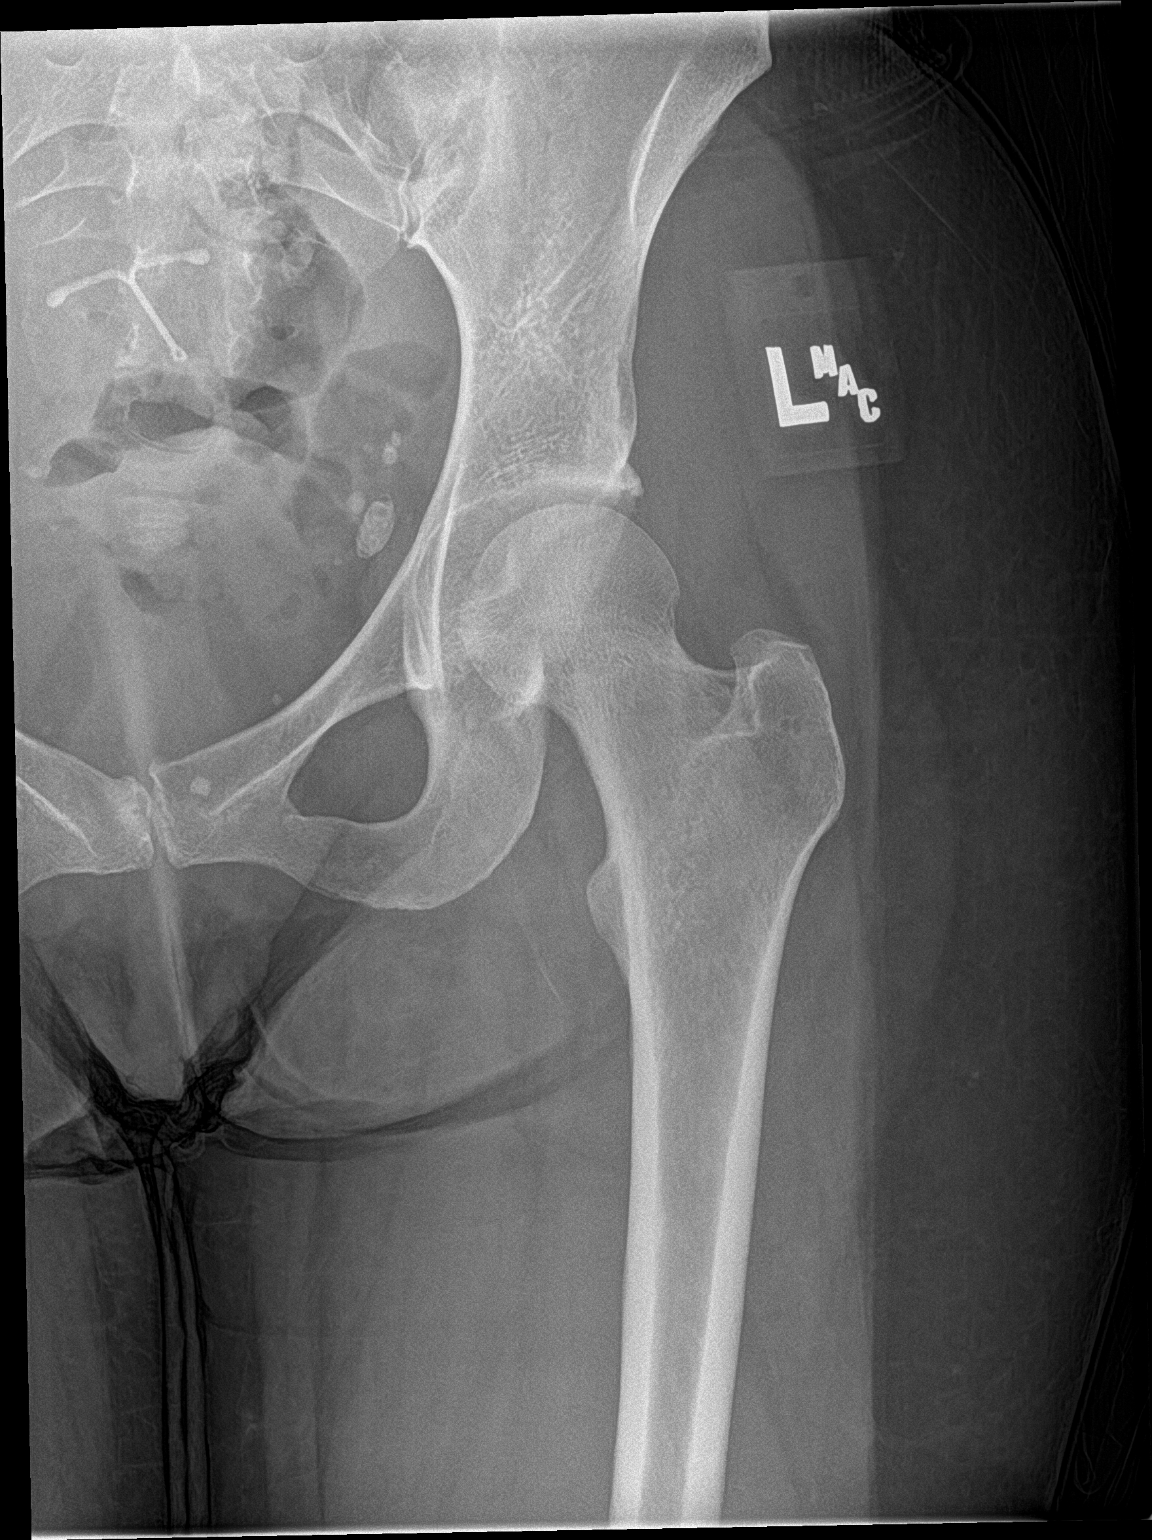

[hip frog leg]
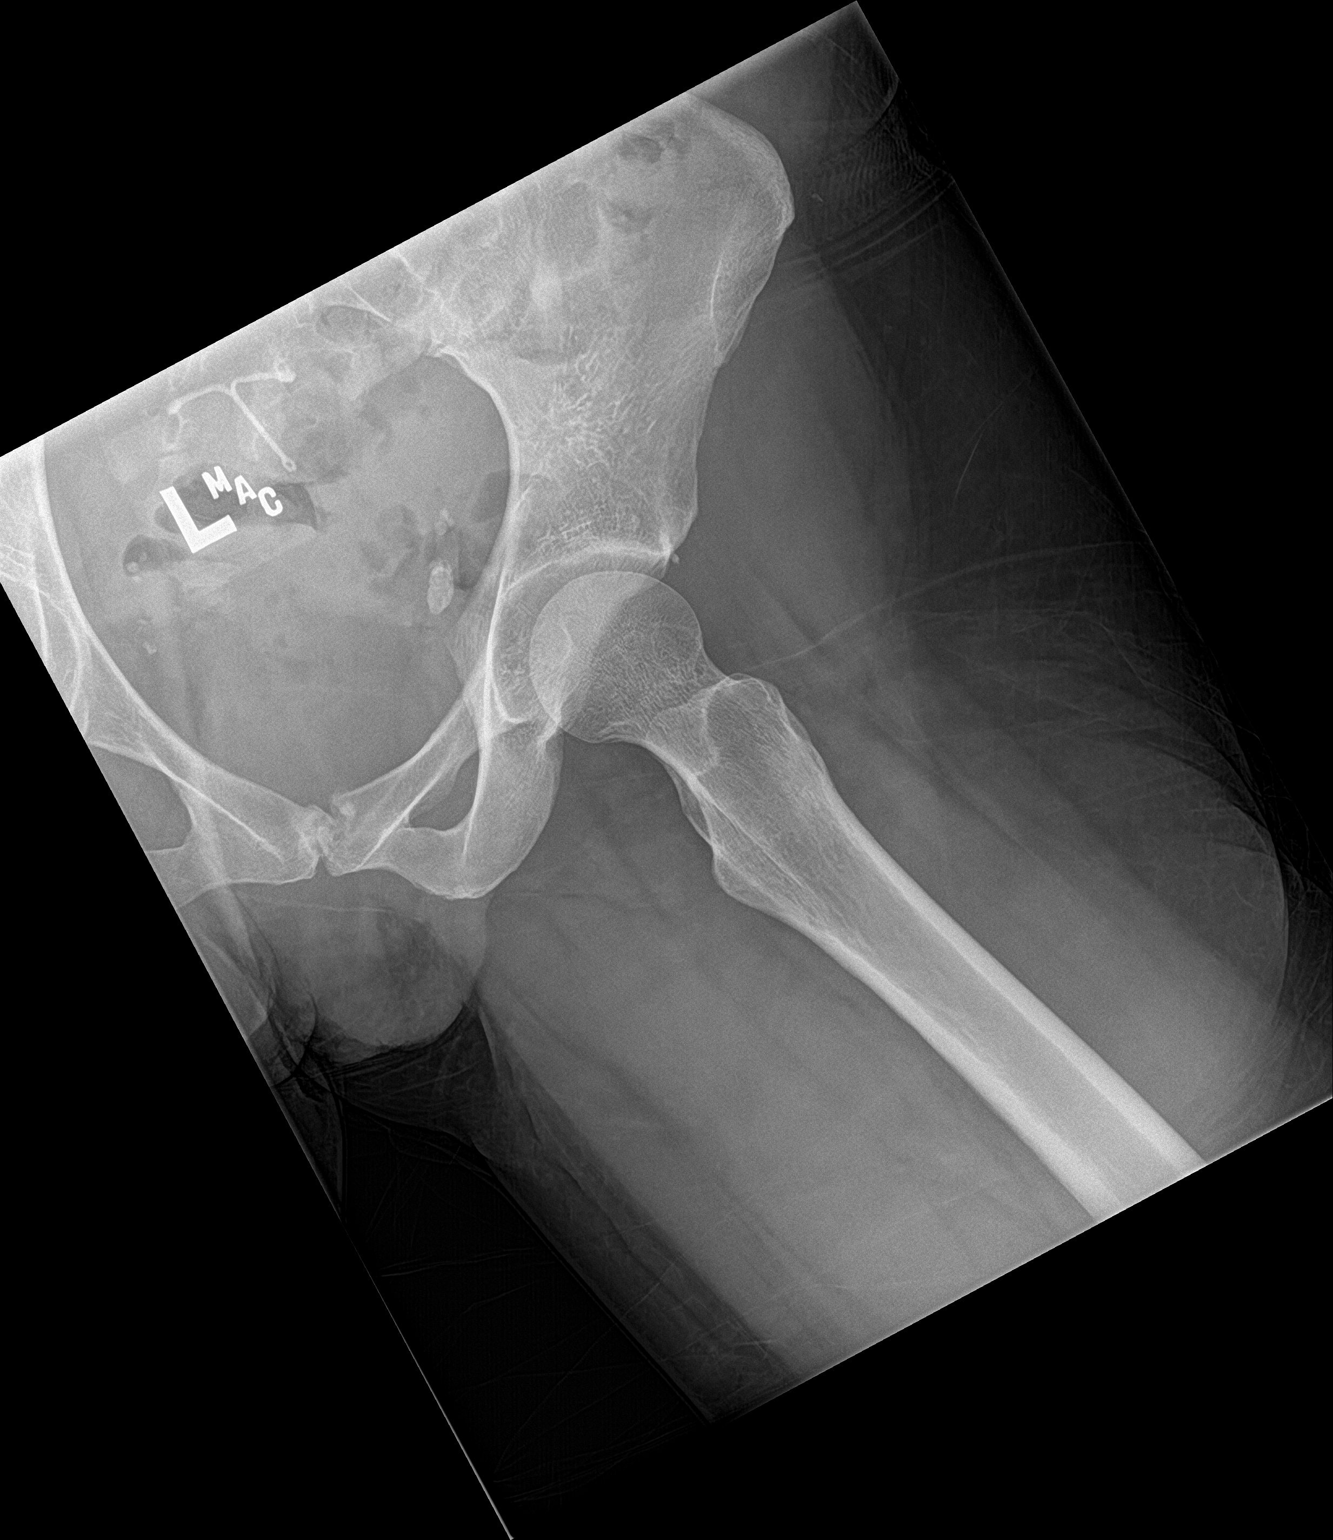

[3 of 3 positions shown; findings below may reference images not displayed]

FINDINGS: There is no evidence of hip fracture or dislocation. There is no
evidence of arthropathy or other focal bone abnormality.
Intrauterine device noted in the mid pelvis. Vascular calcifications
consistent with phleboliths are present bilaterally.
IMPRESSION: No acute osseous abnormality of the pelvis and left hip.

## 2020-04-10 ENCOUNTER — Emergency Department (HOSPITAL_BASED_OUTPATIENT_CLINIC_OR_DEPARTMENT_OTHER)
Admission: EM | Admit: 2020-04-10 | Discharge: 2020-04-10 | Disposition: A | Discharge status: Home / Self Care | Payer: Medicaid Other | Attending: Emergency Medicine | Admitting: Emergency Medicine

## 2020-04-10 ENCOUNTER — Encounter (HOSPITAL_BASED_OUTPATIENT_CLINIC_OR_DEPARTMENT_OTHER): Payer: Self-pay | Admitting: *Deleted

## 2020-04-10 ENCOUNTER — Other Ambulatory Visit: Payer: Self-pay

## 2020-04-10 DIAGNOSIS — U071 COVID-19: Secondary | ICD-10-CM | POA: Insufficient documentation

## 2020-04-10 DIAGNOSIS — F1290 Cannabis use, unspecified, uncomplicated: Secondary | ICD-10-CM | POA: Diagnosis not present

## 2020-04-10 DIAGNOSIS — F17210 Nicotine dependence, cigarettes, uncomplicated: Secondary | ICD-10-CM | POA: Diagnosis not present

## 2020-04-10 DIAGNOSIS — M791 Myalgia, unspecified site: Secondary | ICD-10-CM | POA: Diagnosis present

## 2020-04-10 DIAGNOSIS — Z20822 Contact with and (suspected) exposure to covid-19: Secondary | ICD-10-CM

## 2020-04-10 LAB — SARS CORONAVIRUS 2 BY RT PCR (HOSPITAL ORDER, PERFORMED IN ~~LOC~~ HOSPITAL LAB): SARS Coronavirus 2: POSITIVE — AB

## 2020-04-10 NOTE — ED Notes (Signed)
Pt discharged to home. Discharge instructions have been discussed with patient and/or family members. Pt verbally acknowledges understanding d/c instructions, and endorses comprehension to checkout at registration before leaving.  °

## 2020-04-10 NOTE — ED Provider Notes (Signed)
[facility] EMERGENCY DEPARTMENT Provider Note   CSN: [id number] Arrival date & time: 04/10/20  1911     History Chief Complaint  Patient presents with  . Generalized Body Aches    Sabrina Haynes is a 34 y.o. female.  Patient with high risk Covid contact presents to the emergency department with generalized body aches, sore throat, chills, nasal congestion and cough.  Symptoms worsened today.  Her boyfriend was diagnosed with coronavirus 3 days ago.  No treatments prior to arrival.  No history of asthma or other pulmonary problems.        Past Medical History:  Diagnosis Date  . Heart murmur     There are no problems to display for this patient.   History reviewed. No pertinent surgical history.   OB History    Gravida  4   Para  2   Term  2   Preterm  0   AB  2   Living  2     SAB  1   TAB  1   Ectopic  0   Multiple  0   Live Births  2           No family history on file.  Social History   Tobacco Use  . Smoking status: Current Every Day Smoker    Packs/day: 0.50    Types: Cigarettes  . Smokeless tobacco: Never Used  Vaping Use  . Vaping Use: Never used  Substance Use Topics  . Alcohol use: Yes    Comment: weekly  . Drug use: Yes    Types: Marijuana    Home Medications Prior to Admission medications   Medication Sig Start Date End Date Taking? Authorizing Provider  hydrocortisone cream 1 % Apply to affected area 2 times daily 02/12/18   Dione Booze, MD  lansoprazole (PREVACID) 30 MG capsule Take 1 capsule (30 mg total) by mouth daily at 12 noon. 04/03/19   Molpus, John, MD  loratadine (CLARITIN) 10 MG tablet Take 1 tablet (10 mg total) by mouth daily. 02/12/18   Dione Booze, MD  sertraline (ZOLOFT) 50 MG tablet TK 1 T PO ONCE D 12/06/18   [provider]    Allergies    Latex  Review of Systems   Review of Systems  Constitutional: Positive for chills, fatigue and fever.  HENT: Positive for congestion.  Negative for ear pain, rhinorrhea, sinus pressure and sore throat.   Eyes: Negative for redness.  Respiratory: Positive for cough. Negative for wheezing.   Gastrointestinal: Negative for abdominal pain, diarrhea, nausea and vomiting.  Genitourinary: Negative for dysuria.  Musculoskeletal: Positive for myalgias. Negative for neck stiffness.  Skin: Negative for rash.  Neurological: Positive for headaches.  Hematological: Negative for adenopathy.    Physical Exam Updated Vital Signs BP 119/89 (BP Location: Left Arm)   Pulse 94   Temp 98.4 F (36.9 C) (Oral)   Resp 18   Ht 5\' 5"  (1.651 m)   Wt 68.9 kg   LMP 03/28/2020   SpO2 100%   BMI 25.29 kg/m   Physical Exam Vitals and nursing note reviewed.  Constitutional:      Appearance: She is well-developed.  HENT:     Head: Normocephalic and atraumatic.     Jaw: No trismus.     Right Ear: Tympanic membrane, ear canal and external ear normal.     Left Ear: Tympanic membrane, ear canal and external ear normal.     Nose: Congestion present. No  mucosal edema or rhinorrhea.     Mouth/Throat:     Mouth: Mucous membranes are not dry. No oral lesions.     Pharynx: Uvula midline. No oropharyngeal exudate, posterior oropharyngeal erythema or uvula swelling.     Tonsils: No tonsillar abscesses.  Eyes:     General:        Right eye: No discharge.        Left eye: No discharge.     Conjunctiva/sclera: Conjunctivae normal.  Cardiovascular:     Rate and Rhythm: Normal rate and regular rhythm.     Heart sounds: Normal heart sounds.  Pulmonary:     Effort: Pulmonary effort is normal. No respiratory distress.     Breath sounds: Normal breath sounds. No wheezing or rales.  Abdominal:     Palpations: Abdomen is soft.     Tenderness: There is no abdominal tenderness.  Musculoskeletal:     Cervical back: Normal range of motion and neck supple.  Lymphadenopathy:     Cervical: No cervical adenopathy.  Skin:    General: Skin is warm and dry.   Neurological:     Mental Status: She is alert.     ED Results / Procedures / Treatments   Labs (all labs ordered are listed, but only abnormal results are displayed) Labs Reviewed  SARS CORONAVIRUS 2 BY RT PCR (HOSPITAL ORDER, PERFORMED IN [facility])    EKG None  Radiology No results found.  Procedures Procedures (including critical care time)  Medications Ordered in ED Medications - No data to display  ED Course  I have reviewed the triage vital signs and the nursing notes.  Pertinent labs & imaging results that were available during my care of the patient were reviewed by me and considered in my medical decision making (see chart for details).  Patient seen and examined.  Patient with high risk Covid contact.  Constellation of symptoms suggestive of mild COVID-19 at this point.  No suggestion of significant pneumonia.  Patient be tested for Covid and discharged.  Vital signs are reassuring.  We discussed need for isolation if she test positive.  Discussed return instructions including worsening shortness of breath or trouble breathing.  Vital signs reviewed and are as follows: BP 119/89 (BP Location: Left Arm)   Pulse 94   Temp 98.4 F (36.9 C) (Oral)   Resp 18   Ht 5\' 5"  (1.651 m)   Wt 68.9 kg   LMP 03/28/2020   SpO2 100%   BMI 25.29 kg/m       MDM Rules/Calculators/A&P                          Suspected COVID-19 infection.  Patient looks well, appropriate for discharge.   Final Clinical Impression(s) / ED Diagnoses Final diagnoses:  Person under investigation for COVID-19    Rx / DC Orders ED Discharge Orders    None       03/30/2020, Renne Crigler 04/10/20 2133    2134, MD 04/11/20 [id number]

## 2020-04-10 NOTE — Discharge Instructions (Signed)
Please read and follow all provided instructions.  Your diagnoses today include:  1. Person under investigation for COVID-19    Tests performed today include:  COVID-test  Vital signs. See below for your results today.   Medications prescribed:   None  Take any prescribed medications only as directed.  Home care instructions:  Follow any educational materials contained in this packet.  If you test positive for coronavirus, you need to isolate yourself for 10 days and not break isolation until you have 24 hours of improvement.  BE VERY CAREFUL not to take multiple medicines containing Tylenol (also called acetaminophen). Doing so can lead to an overdose which can damage your liver and cause liver failure and possibly death.   Return instructions:   Please return to the Emergency Department if you experience worsening symptoms.   Return if you have worsening trouble breathing, shortness of breath, persistent vomiting or other concerns.  Please return if you have any other emergent concerns.  Additional Information:  Your vital signs today were: BP 119/89 (BP Location: Left Arm)    Pulse 94    Temp 98.4 F (36.9 C) (Oral)    Resp 18    Ht 5\' 5"  (1.651 m)    Wt 68.9 kg    LMP 03/28/2020    SpO2 100%    BMI 25.29 kg/m  If your blood pressure (BP) was elevated above 135/85 this visit, please have this repeated by your doctor within one month. --------------

## 2020-04-10 NOTE — ED Triage Notes (Signed)
Pt reports scratchy throat, body aches and headache today. She was exposed to her BF who has covid. NAD

## 2020-04-11 ENCOUNTER — Other Ambulatory Visit (HOSPITAL_COMMUNITY): Payer: Self-pay | Admitting: Nurse Practitioner

## 2020-04-11 DIAGNOSIS — U071 COVID-19: Secondary | ICD-10-CM

## 2020-04-11 NOTE — Progress Notes (Signed)
I connected by phone with Kirkland Hun on 04/11/2020 at 4:09 PM to discuss the potential use of an new treatment for mild to moderate COVID-19 viral infection in non-hospitalized patients.  This patient is a 34 y.o. female that meets the FDA criteria for Emergency Use Authorization of casirivimab\imdevimab.  Has a (+) direct SARS-CoV-2 viral test result  Has mild or moderate COVID-19   Is ? 35 years of age and weighs ? 40 kg  Is NOT hospitalized due to COVID-19  Is NOT requiring oxygen therapy or requiring an increase in baseline oxygen flow rate due to COVID-19  Is within 10 days of symptom onset  Has at least one of the high risk factor(s) for progression to severe COVID-19 and/or hospitalization as defined in EUA.  Specific high risk criteria : BMI > 25, hx of asthma.    I have spoken and communicated the following to the patient or parent/caregiver:  1. FDA has authorized the emergency use of casirivimab\imdevimab for the treatment of mild to moderate COVID-19 in adults and pediatric patients with positive results of direct SARS-CoV-2 viral testing who are 49 years of age and older weighing at least 40 kg, and who are at high risk for progressing to severe COVID-19 and/or hospitalization.  2. The significant known and potential risks and benefits of casirivimab\imdevimab, and the extent to which such potential risks and benefits are unknown.  3. Information on available alternative treatments and the risks and benefits of those alternatives, including clinical trials.  4. Patients treated with casirivimab\imdevimab should continue to self-isolate and use infection control measures (e.g., wear mask, isolate, social distance, avoid sharing personal items, clean and disinfect "high touch" surfaces, and frequent handwashing) according to CDC guidelines.   5. The patient or parent/caregiver has the option to accept or refuse casirivimab\imdevimab .  After reviewing this information  with the patient, The patient agreed to proceed with receiving casirivimab\imdevimab infusion and will be provided a copy of the Fact sheet prior to receiving the infusion.Consuello Masse, DNP, AGNP-C [phone] (Infusion Center Hotline)

## 2020-04-14 MED ORDER — SODIUM CHLORIDE 0.9 % IV SOLN
1200.0000 mg | Freq: Once | INTRAVENOUS | Status: DC
  Filled 2020-04-14: qty 10

## 2020-04-15 ENCOUNTER — Ambulatory Visit (HOSPITAL_COMMUNITY)
Admission: RE | Admit: 2020-04-15 | Discharge: 2020-04-15 | Disposition: A | Discharge status: Home / Self Care | Payer: Medicaid Other | Source: Ambulatory Visit | Attending: Nurse Practitioner | Admitting: Nurse Practitioner

## 2020-04-15 DIAGNOSIS — U071 COVID-19: Secondary | ICD-10-CM

## 2021-03-13 IMAGING — US TRANSVAGINAL ULTRASOUND OF PELVIS
1 series · 14 of 25 positions shown · non-contrast
Comparison: None.

CLINICAL DATA: Left lower quadrant tenderness 1 month. Negative
urine pregnancy test 03/15/2019. Negative serum HCG 03/17/2019

EXAM:
ULTRASOUND PELVIS TRANSVAGINAL
TECHNIQUE: Transvaginal ultrasound examination of the pelvis was performed
including evaluation of the uterus, ovaries, adnexal regions, and
pelvic cul-de-sac.

[Series 1: transvaginal ultrasound of pelvis · 14 of 65 slices shown]
[im 1/65]
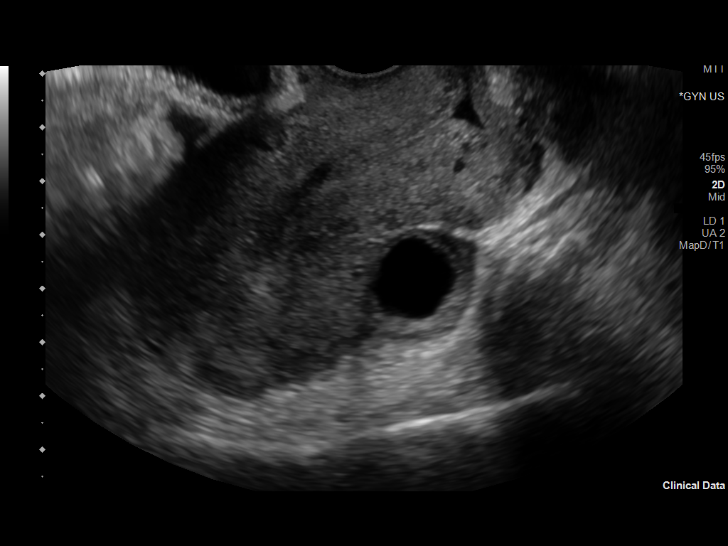
[im 6/65]
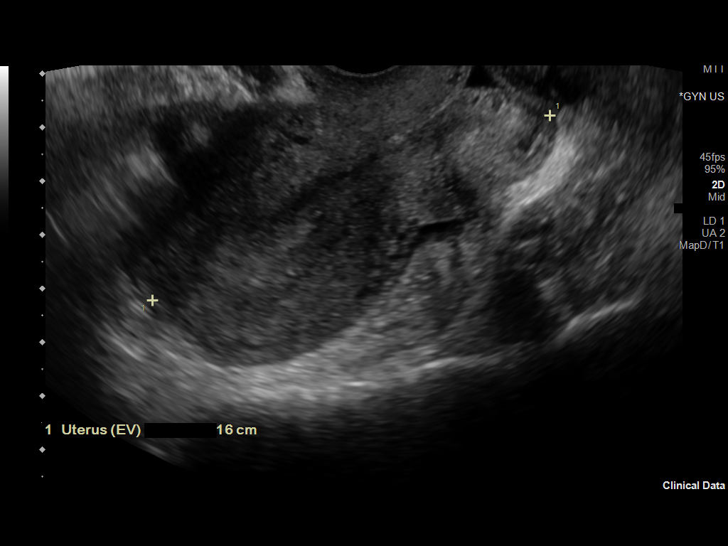
[im 11/65]
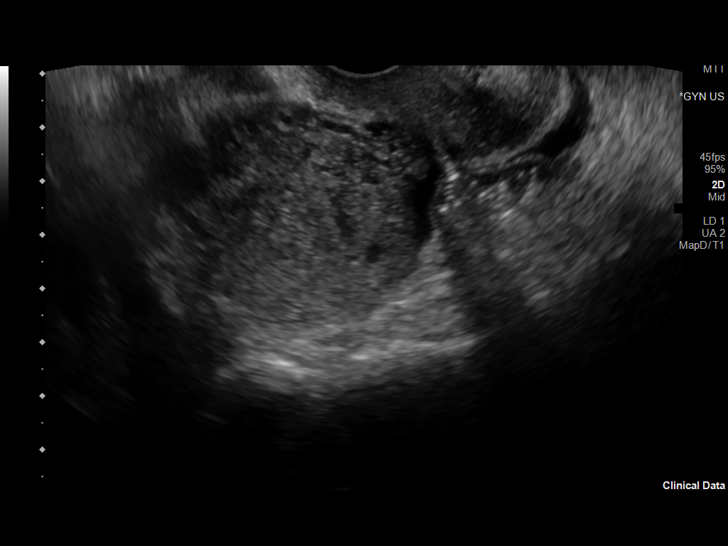
[im 17/65]
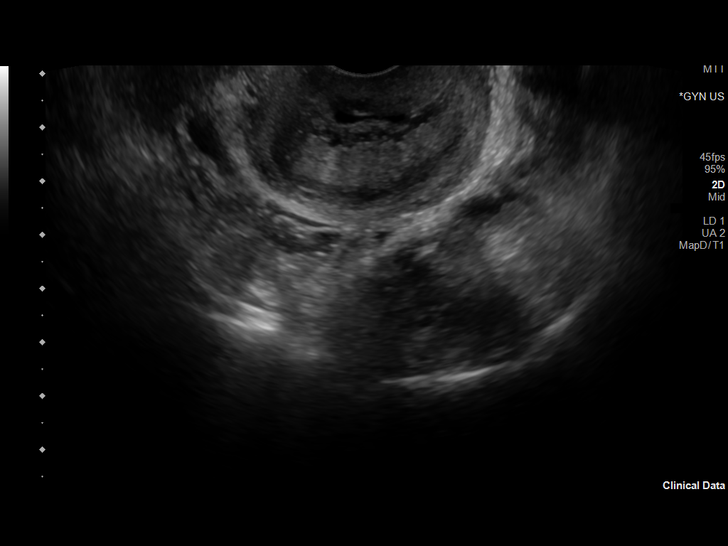
[im 22/65]
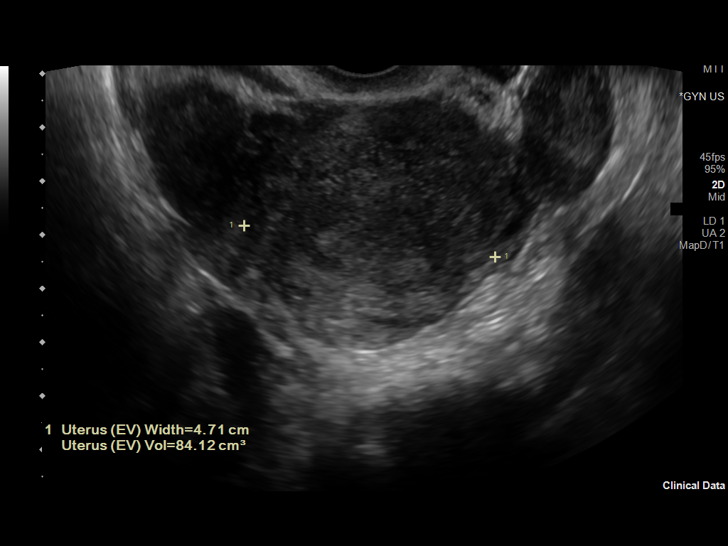
[im 25/65]
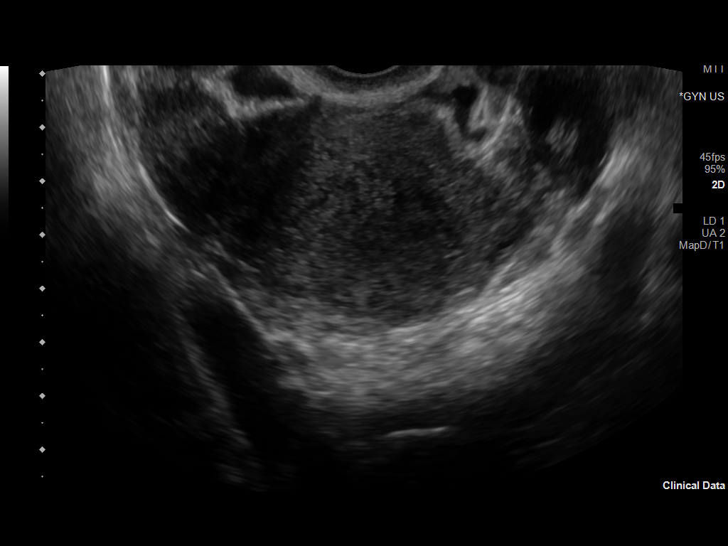
[im 30/65]
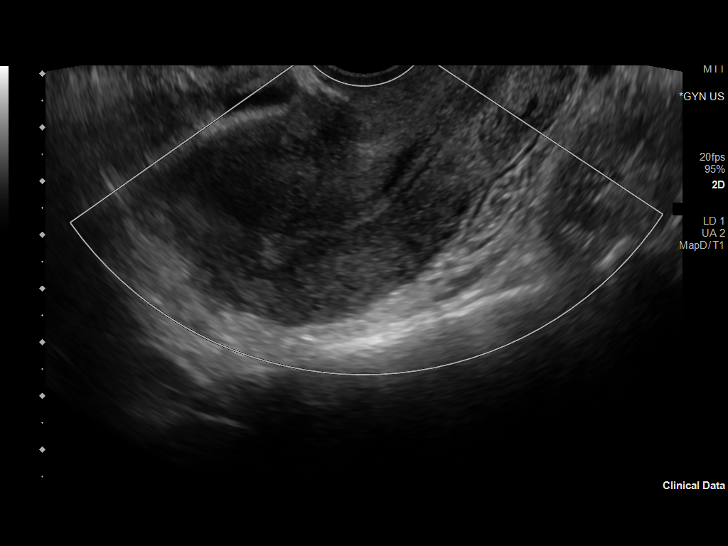
[im 35/65]
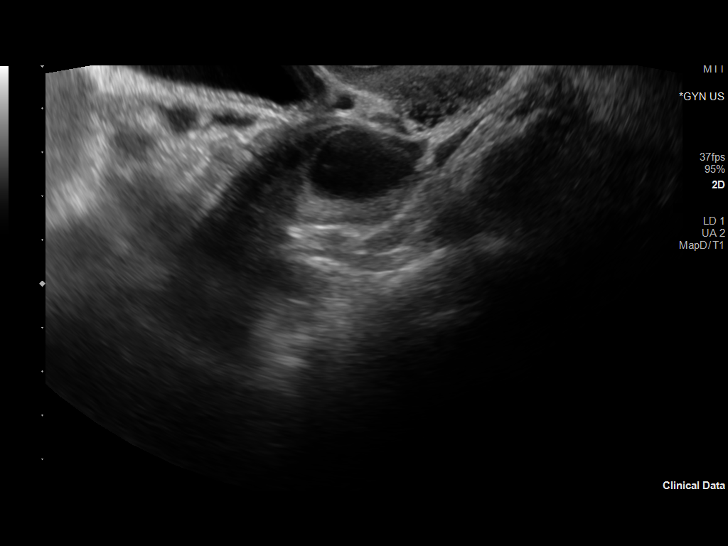
[im 41/65]
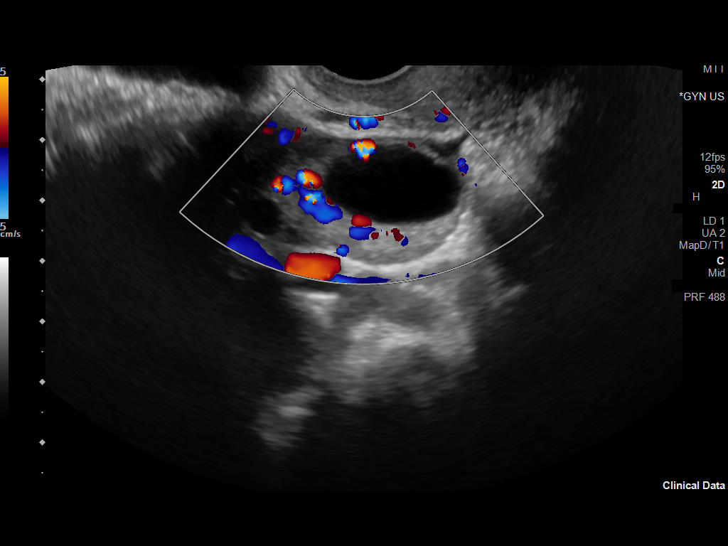
[im 43/65]
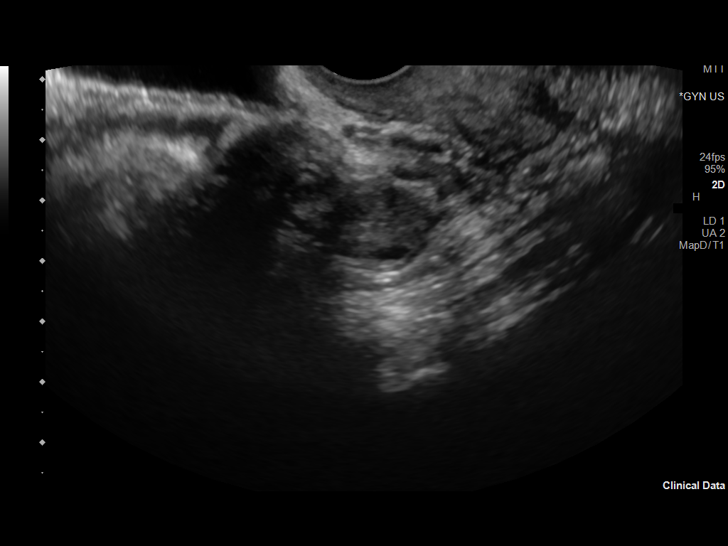
[im 49/65]
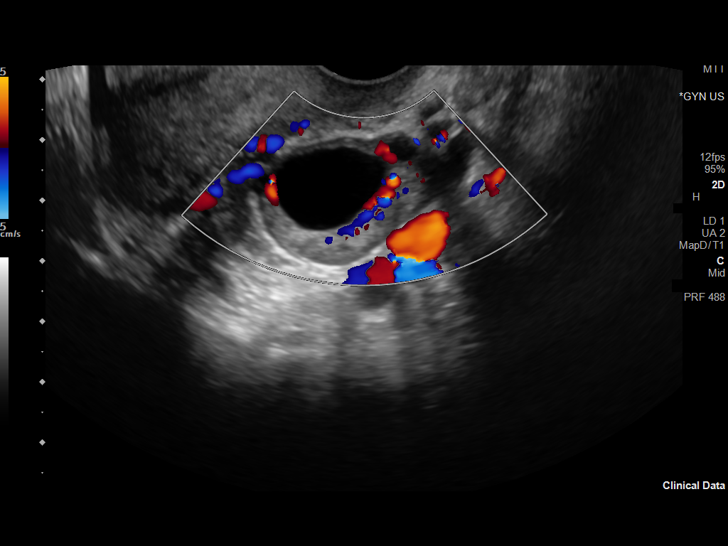
[im 54/65]
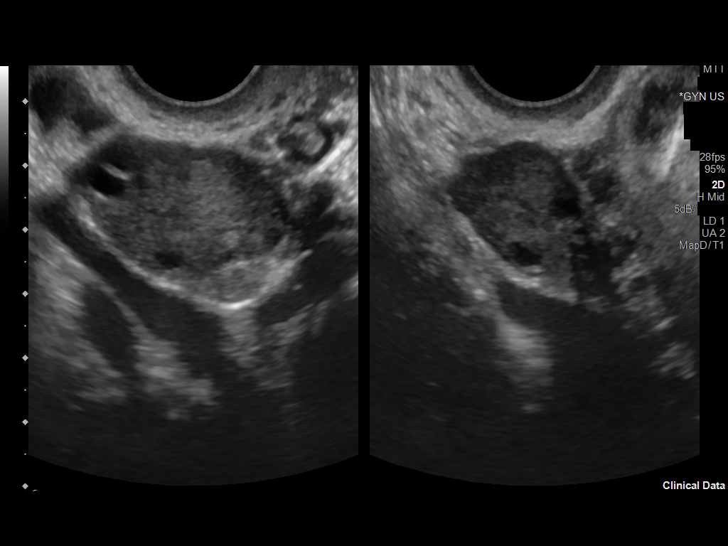
[im 59/65]
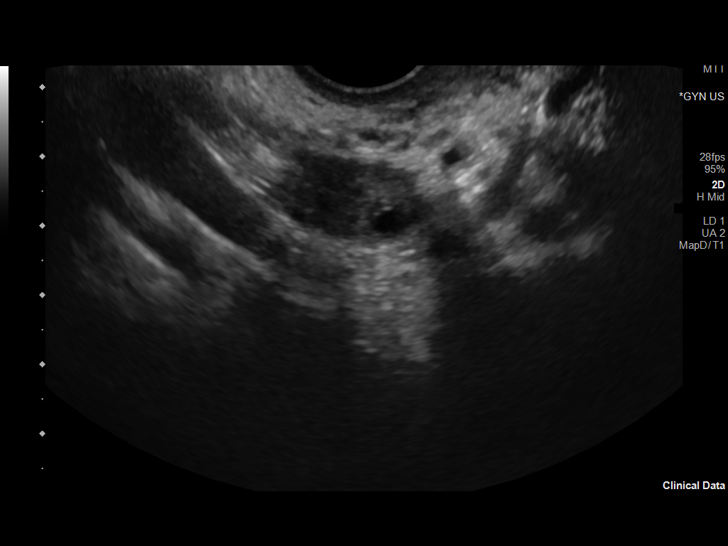
[im 65/65]
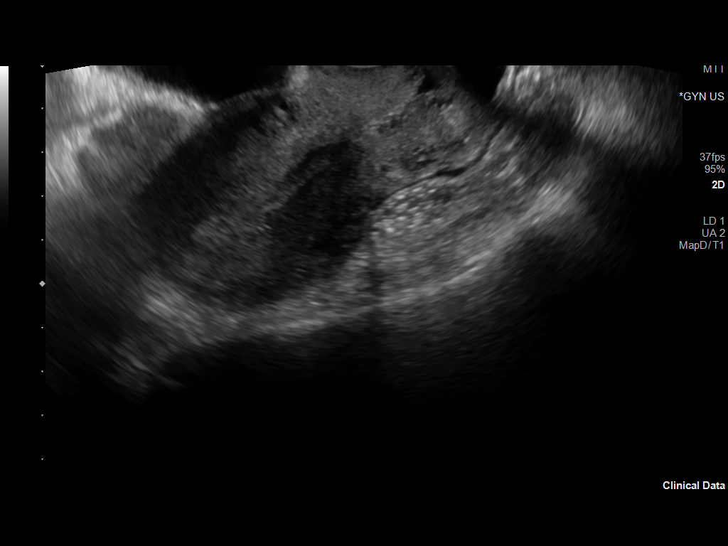

[14 of 25 positions shown; findings below may reference images not displayed]

FINDINGS: Uterus

Measurements: 8.2 x 4.2 x 4.7 cm = volume: 84.1 mL. No focal lesion.
Mildly heterogeneous myometrium

Endometrium

Thickness: 6.8 mm.  No focal abnormality visualized.

Right ovary

Measurements: 3.2 x 2.0 x 2.4 cm = volume: 8.2 mL. Normal
appearance/no adnexal mass.

Left ovary

Measurements: 3.6 x 2.2 x 2.6 cm = volume: 10.8 mL. Negative for
mass. Left ovarian follicle 13 x 19 mm.

Other findings:  Trace free fluid
IMPRESSION: No significant abnormality.

## 2022-01-24 ENCOUNTER — Encounter (HOSPITAL_BASED_OUTPATIENT_CLINIC_OR_DEPARTMENT_OTHER): Payer: Self-pay

## 2022-01-24 ENCOUNTER — Other Ambulatory Visit: Payer: Self-pay

## 2022-01-24 DIAGNOSIS — S60562A Insect bite (nonvenomous) of left hand, initial encounter: Secondary | ICD-10-CM | POA: Insufficient documentation

## 2022-01-24 DIAGNOSIS — S0096XA Insect bite (nonvenomous) of unspecified part of head, initial encounter: Secondary | ICD-10-CM | POA: Insufficient documentation

## 2022-01-24 DIAGNOSIS — S80861A Insect bite (nonvenomous), right lower leg, initial encounter: Secondary | ICD-10-CM | POA: Diagnosis not present

## 2022-01-24 DIAGNOSIS — S60561A Insect bite (nonvenomous) of right hand, initial encounter: Secondary | ICD-10-CM | POA: Diagnosis not present

## 2022-01-24 DIAGNOSIS — W57XXXA Bitten or stung by nonvenomous insect and other nonvenomous arthropods, initial encounter: Secondary | ICD-10-CM | POA: Insufficient documentation

## 2022-01-24 NOTE — ED Triage Notes (Signed)
Pt presents to ED from home C/O multiple insect bites/bumps on face, hands, and leg. Pt reports the bites are itchy. Pt does not know what bit her.

## 2022-01-24 NOTE — ED Triage Notes (Signed)
Pt approached this RN stating she is "losing vision in R eye." Will notify MD.

## 2022-01-25 ENCOUNTER — Emergency Department (HOSPITAL_BASED_OUTPATIENT_CLINIC_OR_DEPARTMENT_OTHER)
Admission: EM | Admit: 2022-01-25 | Discharge: 2022-01-25 | Disposition: A | Discharge status: Home / Self Care | Payer: Medicaid Other | Attending: Emergency Medicine | Admitting: Emergency Medicine

## 2022-01-25 DIAGNOSIS — W57XXXA Bitten or stung by nonvenomous insect and other nonvenomous arthropods, initial encounter: Secondary | ICD-10-CM

## 2022-01-25 MED ORDER — FAMOTIDINE 20 MG PO TABS: 20.0000 mg | ORAL_TABLET | Freq: Two times a day (BID) | ORAL | 0 refills | Status: DC

## 2022-01-25 MED ORDER — PREDNISONE 20 MG PO TABS: ORAL_TABLET | ORAL | 0 refills | Status: DC

## 2022-01-25 MED ORDER — FAMOTIDINE 20 MG PO TABS
20.0000 mg | ORAL_TABLET | Freq: Once | ORAL | Status: AC
  Administered 2022-01-25: 20 mg via ORAL
  Filled 2022-01-25: qty 1

## 2022-01-25 MED ORDER — PREDNISONE 10 MG PO TABS
60.0000 mg | ORAL_TABLET | Freq: Once | ORAL | Status: AC
Start: 2022-01-25 — End: 2022-01-25
  Administered 2022-01-25: 60 mg via ORAL
  Filled 2022-01-25: qty 1

## 2022-01-25 NOTE — ED Provider Notes (Signed)
[facility] EMERGENCY DEPARTMENT Provider Note   CSN: [id number] Arrival date & time: 01/24/22  2023     History  Chief Complaint  Patient presents with   Insect Bite    Sabrina Haynes is a 36 y.o. female.  The history is provided by the patient.  Animal Bite Contact animal:  Insect Location:  Head/neck, shoulder/arm and leg Head/neck injury location:  Head Shoulder/arm injury location:  L hand and R hand Leg injury location:  R upper leg Time since incident:  1 day Incident location:  Home Provoked: unprovoked   Notifications:  Patent examiner Tetanus status:  Up to date Relieved by:  Nothing Worsened by:  Nothing Ineffective treatments:  None tried Associated symptoms: rash   Associated symptoms: no fever   Bites with itching.  Seen by urgent care and told she had an infection.  Took benadryl with improvement.  Has not taken antibiotics.      Home Medications Prior to Admission medications   Medication Sig Start Date End Date Taking? Authorizing Provider  hydrocortisone cream 1 % Apply to affected area 2 times daily 02/12/18   Dione Booze, MD  lansoprazole (PREVACID) 30 MG capsule Take 1 capsule (30 mg total) by mouth daily at 12 noon. 04/03/19   Molpus, John, MD  loratadine (CLARITIN) 10 MG tablet Take 1 tablet (10 mg total) by mouth daily. 02/12/18   Dione Booze, MD  sertraline (ZOLOFT) 50 MG tablet TK 1 T PO ONCE D 12/06/18   [provider]      Allergies    Latex    Review of Systems   Review of Systems  Constitutional:  Negative for fever.  HENT:  Negative for facial swelling.   Eyes:  Negative for photophobia and visual disturbance.  Respiratory:  Negative for wheezing and stridor.   Cardiovascular:  Negative for palpitations and leg swelling.  Gastrointestinal:  Negative for abdominal pain.  Skin:  Positive for rash.  Psychiatric/Behavioral:  Negative for agitation.   All other systems reviewed and are negative.  Physical  Exam Updated Vital Signs BP (!) 139/109 (BP Location: Left Arm)   Pulse 81   Temp 98.3 F (36.8 C) (Oral)   Resp 20   Ht 5\' 5"  (1.651 m)   Wt 68 kg   SpO2 100%   BMI 24.96 kg/m  Physical Exam Vitals and nursing note reviewed.  Constitutional:      General: She is not in acute distress.    Appearance: Normal appearance. She is well-developed.  HENT:     Head: Normocephalic and atraumatic.      Nose: Nose normal.  Eyes:     Pupils: Pupils are equal, round, and reactive to light.  Cardiovascular:     Rate and Rhythm: Normal rate and regular rhythm.  Pulmonary:     Effort: No respiratory distress.     Breath sounds: Normal breath sounds.  Abdominal:     General: Bowel sounds are normal. There is no distension.     Palpations: Abdomen is soft.     Tenderness: There is no abdominal tenderness. There is no guarding or rebound.  Genitourinary:    Vagina: No vaginal discharge.  Musculoskeletal:        General: Normal range of motion.     Cervical back: Neck supple.  Skin:    General: Skin is warm and dry.     Capillary Refill: Capillary refill takes less than 2 seconds.     Findings: No erythema  or rash.       Neurological:     General: No focal deficit present.     Mental Status: She is alert and oriented to person, place, and time.    ED Results / Procedures / Treatments   Labs (all labs ordered are listed, but only abnormal results are displayed) Labs Reviewed - No data to display  EKG None  Radiology No results found.  Procedures Procedures    Medications Ordered in ED Medications  famotidine (PEPCID) tablet 20 mg (has no administration in time range)  predniSONE (DELTASONE) tablet 60 mg (has no administration in time range)    ED Course/ Medical Decision Making/ A&P                           Medical Decision Making Bites with itching   Amount and/or Complexity of Data Reviewed External Data Reviewed: notes.    Details: previous notes  reviewed  Risk Prescription drug management. Risk Details: These are clearly insect bites.  I cannot tell patient what bit her.  I have advised looking around her home.  Benadryl for itching.  Add pepcid and prednisone.  I do not believe these are infected at this time.  Hold antibiotics.  FOllow up with your PMD.  Eyes feel dry since benadryl.    Visual Acuity  Right Eye Distance: 20/20 Left Eye Distance: 20/20 Bilateral Distance: 20/20 (pt does not wearglasses)  Right Eye Near: R Near: 20/20 Left Eye Near:  L Near: 20/20 Bilateral Near:    Dry eyes can happen on benadryl     Final Clinical Impression(s) / ED Diagnoses Final diagnoses:  None   Return for intractable cough, coughing up blood, fevers > 100.4 unrelieved by medication, shortness of breath, intractable vomiting, chest pain, shortness of breath, weakness, numbness, changes in speech, facial asymmetry, abdominal pain, passing out, Inability to tolerate liquids or food, cough, altered mental status or any concerns. No signs of systemic illness or infection. The patient is nontoxic-appearing on exam and vital signs are within normal limits.  I have reviewed the triage vital signs and the nursing notes. Pertinent labs & imaging results that were available during my care of the patient were reviewed by me and considered in my medical decision making (see chart for details). After history, exam, and medical workup I feel the patient has been appropriately medically screened and is safe for discharge home. Pertinent diagnoses were discussed with the patient. Patient was given return precautions.  Rx / DC Orders ED Discharge Orders     None         Jahquan Klugh, MD 01/25/22 [id number]

## 2024-01-16 DIAGNOSIS — F1290 Cannabis use, unspecified, uncomplicated: Secondary | ICD-10-CM | POA: Diagnosis present

## 2024-01-16 DIAGNOSIS — E559 Vitamin D deficiency, unspecified: Secondary | ICD-10-CM | POA: Diagnosis present

## 2024-01-16 DIAGNOSIS — Z720 Tobacco use: Secondary | ICD-10-CM | POA: Diagnosis present

## 2024-05-27 DIAGNOSIS — R072 Precordial pain: Secondary | ICD-10-CM | POA: Diagnosis present

## 2024-05-27 DIAGNOSIS — R9431 Abnormal electrocardiogram [ECG] [EKG]: Secondary | ICD-10-CM | POA: Diagnosis present

## 2024-05-27 DIAGNOSIS — R002 Palpitations: Secondary | ICD-10-CM | POA: Insufficient documentation

## 2024-10-10 ENCOUNTER — Emergency Department (HOSPITAL_COMMUNITY)

## 2024-10-10 ENCOUNTER — Encounter (HOSPITAL_COMMUNITY): Payer: Self-pay | Admitting: Internal Medicine

## 2024-10-10 ENCOUNTER — Inpatient Hospital Stay (HOSPITAL_COMMUNITY): Admission: EM | Admit: 2024-10-10 | Source: Home / Self Care | Admitting: Internal Medicine

## 2024-10-10 DIAGNOSIS — R072 Precordial pain: Secondary | ICD-10-CM | POA: Diagnosis present

## 2024-10-10 DIAGNOSIS — R079 Chest pain, unspecified: Principal | ICD-10-CM

## 2024-10-10 DIAGNOSIS — F1290 Cannabis use, unspecified, uncomplicated: Secondary | ICD-10-CM | POA: Diagnosis present

## 2024-10-10 DIAGNOSIS — R9431 Abnormal electrocardiogram [ECG] [EKG]: Secondary | ICD-10-CM | POA: Diagnosis present

## 2024-10-10 DIAGNOSIS — F4310 Post-traumatic stress disorder, unspecified: Secondary | ICD-10-CM | POA: Diagnosis present

## 2024-10-10 DIAGNOSIS — Z720 Tobacco use: Secondary | ICD-10-CM | POA: Diagnosis present

## 2024-10-10 DIAGNOSIS — I214 Non-ST elevation (NSTEMI) myocardial infarction: Secondary | ICD-10-CM | POA: Diagnosis present

## 2024-10-10 DIAGNOSIS — E559 Vitamin D deficiency, unspecified: Secondary | ICD-10-CM | POA: Diagnosis present

## 2024-10-10 DIAGNOSIS — J439 Emphysema, unspecified: Secondary | ICD-10-CM | POA: Diagnosis present

## 2024-10-10 LAB — BASIC METABOLIC PANEL WITH GFR
Anion gap: 10 (ref 5–15)
BUN: 6 mg/dL (ref 6–20)
CO2: 25 mmol/L (ref 22–32)
Calcium: 9.5 mg/dL (ref 8.9–10.3)
Chloride: 104 mmol/L (ref 98–111)
Creatinine, Ser: 0.75 mg/dL (ref 0.44–1.00)
GFR, Estimated: >60 mL/min
Glucose, Bld: 109 mg/dL — ABNORMAL HIGH (ref 70–99)
Potassium: 3.7 mmol/L (ref 3.5–5.1)
Sodium: 139 mmol/L (ref 135–145)

## 2024-10-10 LAB — TROPONIN T, HIGH SENSITIVITY
Troponin T High Sensitivity: 18 ng/L (ref 0–19)
Troponin T High Sensitivity: 22 ng/L — ABNORMAL HIGH (ref 0–19)
Troponin T High Sensitivity: 39 ng/L — ABNORMAL HIGH (ref 0–19)
Troponin T High Sensitivity: 80 ng/L — ABNORMAL HIGH (ref 0–19)

## 2024-10-10 LAB — D-DIMER, QUANTITATIVE: D-Dimer, Quant: 0.27 ug{FEU}/mL (ref 0.00–0.50)

## 2024-10-10 LAB — HCG, SERUM, QUALITATIVE: Preg, Serum: NEGATIVE

## 2024-10-10 LAB — CBC
HCT: 42.9 % (ref 36.0–46.0)
Hemoglobin: 14.3 g/dL (ref 12.0–15.0)
MCH: 29.3 pg (ref 26.0–34.0)
MCHC: 33.3 g/dL (ref 30.0–36.0)
MCV: 87.9 fL (ref 80.0–100.0)
Platelets: 354 10*3/uL (ref 150–400)
RBC: 4.88 MIL/uL (ref 3.87–5.11)
RDW: 14 % (ref 11.5–15.5)
WBC: 4.5 10*3/uL (ref 4.0–10.5)
nRBC: 0 % (ref 0.0–0.2)

## 2024-10-10 LAB — APTT: aPTT: 96 s — ABNORMAL HIGH (ref 24–36)

## 2024-10-10 LAB — HEPARIN LEVEL (UNFRACTIONATED): Heparin Unfractionated: 0.25 [IU]/mL — ABNORMAL LOW (ref 0.30–0.70)

## 2024-10-10 MED ORDER — ASPIRIN 81 MG PO CHEW
324.0000 mg | CHEWABLE_TABLET | Freq: Once | ORAL | Status: AC
  Administered 2024-10-10: 324 mg via ORAL
  Filled 2024-10-10: qty 4

## 2024-10-10 MED ORDER — ASPIRIN 81 MG PO TBEC: 81.0000 mg | DELAYED_RELEASE_TABLET | Freq: Every day | ORAL | Status: AC

## 2024-10-10 MED ORDER — HYDROXYZINE HCL 10 MG PO TABS
10.0000 mg | ORAL_TABLET | Freq: Every day | ORAL | Status: AC | PRN
  Administered 2024-10-10: 10 mg via ORAL
  Filled 2024-10-10: qty 1

## 2024-10-10 MED ORDER — METOPROLOL TARTRATE 25 MG PO TABS
25.0000 mg | ORAL_TABLET | Freq: Two times a day (BID) | ORAL | Status: AC
  Administered 2024-10-10: 25 mg via ORAL
  Filled 2024-10-10: qty 1

## 2024-10-10 MED ORDER — HEPARIN (PORCINE) 25000 UT/250ML-% IV SOLN
900.0000 [IU]/h | INTRAVENOUS | Status: AC
  Administered 2024-10-10: 900 [IU]/h via INTRAVENOUS
  Filled 2024-10-10: qty 250

## 2024-10-10 MED ORDER — NICOTINE 21 MG/24HR TD PT24
21.0000 mg | MEDICATED_PATCH | Freq: Every day | TRANSDERMAL | Status: AC
  Administered 2024-10-10: 21 mg via TRANSDERMAL
  Filled 2024-10-10: qty 1

## 2024-10-10 MED ORDER — HEPARIN BOLUS VIA INFUSION
4000.0000 [IU] | Freq: Once | INTRAVENOUS | Status: AC
  Administered 2024-10-10: 4000 [IU] via INTRAVENOUS
  Filled 2024-10-10: qty 4000

## 2024-10-10 MED ORDER — NITROGLYCERIN 0.4 MG SL SUBL: 0.4000 mg | SUBLINGUAL_TABLET | SUBLINGUAL | Status: AC | PRN

## 2024-10-10 MED ORDER — VITAMIN D 25 MCG (1000 UNIT) PO TABS
5000.0000 [IU] | ORAL_TABLET | Freq: Every day | ORAL | Status: AC
  Administered 2024-10-10: 5000 [IU] via ORAL
  Filled 2024-10-10: qty 5

## 2024-10-10 MED ORDER — ONDANSETRON HCL 4 MG/2ML IJ SOLN: 4.0000 mg | Freq: Four times a day (QID) | INTRAMUSCULAR | Status: AC | PRN

## 2024-10-10 MED ORDER — NICOTINE 21 MG/24HR TD PT24: 21.0000 mg | MEDICATED_PATCH | Freq: Every day | TRANSDERMAL | Status: DC | PRN

## 2024-10-10 MED ORDER — IPRATROPIUM-ALBUTEROL 0.5-2.5 (3) MG/3ML IN SOLN: 3.0000 mL | RESPIRATORY_TRACT | Status: AC | PRN

## 2024-10-10 MED ORDER — IOHEXOL 350 MG/ML SOLN
100.0000 mL | Freq: Once | INTRAVENOUS | Status: AC | PRN
  Administered 2024-10-10: 100 mL via INTRAVENOUS

## 2024-10-10 MED ORDER — LABETALOL HCL 5 MG/ML IV SOLN: 10.0000 mg | INTRAVENOUS | Status: AC | PRN

## 2024-10-10 MED ORDER — ACETAMINOPHEN 325 MG PO TABS: 650.0000 mg | ORAL_TABLET | Freq: Four times a day (QID) | ORAL | Status: AC | PRN

## 2024-10-10 NOTE — ED Provider Notes (Signed)
 " [facility] AT [facility] Provider Note  CSN: [id number] Arrival date & time: 10/10/24 9052  Chief Complaint(s) Chest Pain and Shortness of Breath  HPI Rupinder Brodzinski is a 39 y.o. female who is here today for chest pain.  Patient reports that she woke up at 8:30 in the morning, had an argument with a child and then began to develop pain on the central right side of her chest.  She states she began to sweat.  She also felt short of breath.  She does take Depo for birth control.  Denies any other medications.  Patient is concerned because she has a family history of MI in her father.   Past Medical History Past Medical History:  Diagnosis Date   Heart murmur    There are no active problems to display for this patient.  Home Medication(s) Prior to Admission medications  Medication Sig Start Date End Date Taking? Authorizing Provider  hydrOXYzine  (ATARAX ) 10 MG tablet Take 10 mg by mouth daily as needed for anxiety. 09/03/24  Yes [provider]  metroNIDAZOLE (FLAGYL) 500 MG tablet Take 500 mg by mouth. Patient not taking: Reported on 10/10/2024 09/23/24   [provider]  valACYclovir (VALTREX) 500 MG tablet Take by mouth 2 (two) times daily. Patient not taking: Reported on 10/10/2024 09/23/24   [provider]                                                                                                                                    Past Surgical History No past surgical history on file. Family History No family history on file.  Social History Social History[1] Allergies Latex  Review of Systems Review of Systems  Physical Exam Vital Signs  I have reviewed the triage vital signs BP (!) 154/113 (BP Location: Left Arm)   Pulse 86   Temp 98.3 F (36.8 C) (Oral)   Resp 17   SpO2 99%   Physical Exam Vitals reviewed.  Constitutional:      Appearance: She is not toxic-appearing.  Cardiovascular:     Rate and  Rhythm: Normal rate.     Heart sounds: Normal heart sounds.  Pulmonary:     Effort: Pulmonary effort is normal.     Breath sounds: Normal breath sounds.  Chest:     Chest wall: No mass or tenderness.  Abdominal:     Palpations: Abdomen is soft.  Musculoskeletal:        General: Normal range of motion.  Skin:    General: Skin is warm.  Neurological:     Mental Status: She is alert.     ED Results and Treatments Labs (all labs ordered are listed, but only abnormal results are displayed) Labs Reviewed  BASIC METABOLIC PANEL WITH GFR - Abnormal; Notable for the following components:      Result Value   Glucose, Bld 109 (*)  All other components within normal limits  TROPONIN T, HIGH SENSITIVITY - Abnormal; Notable for the following components:   Troponin T High Sensitivity 22 (*)    All other components within normal limits  TROPONIN T, HIGH SENSITIVITY - Abnormal; Notable for the following components:   Troponin T High Sensitivity 39 (*)    All other components within normal limits  CBC  HCG, SERUM, QUALITATIVE  D-DIMER, QUANTITATIVE  TROPONIN T, HIGH SENSITIVITY  TROPONIN T, HIGH SENSITIVITY                                                                                                                          Radiology CT Angio Chest/Abd/Pel for Dissection W and/or W/WO Result Date: 10/10/2024 CLINICAL DATA:  Chest pain EXAM: CT ANGIOGRAPHY CHEST, ABDOMEN AND PELVIS TECHNIQUE: Non-contrast CT of the chest was initially obtained. Multidetector CT imaging through the chest, abdomen and pelvis was performed using the standard protocol during bolus administration of intravenous contrast. Multiplanar reconstructed images and MIPs were obtained and reviewed to evaluate the vascular anatomy. RADIATION DOSE REDUCTION: This exam was performed according to the departmental dose-optimization program which includes automated exposure control, adjustment of the mA and/or kV according to  patient size and/or use of iterative reconstruction technique. CONTRAST:  OMNIPAQUE  IOHEXOL  350 MG/ML SOLN COMPARISON:  None Available. FINDINGS: CTA CHEST FINDINGS Cardiovascular: Preferential opacification of the thoracic aorta. No evidence of thoracic aortic aneurysm or dissection. Normal heart size. No pericardial effusion. No definite evidence of pulmonary embolus. Mediastinum/Nodes: No enlarged mediastinal or hilar lymph nodes. Mildly enlarged bilateral axillary adenopathy is noted which most likely is reactive or inflammatory in etiology. Thyroid  gland, trachea, and esophagus demonstrate no significant findings. Lungs/Pleura: No pneumothorax or pleural effusion is noted. Multiple small rounded lucencies are noted throughout both lungs most predominantly seen in the upper lobes. 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 Musculoskeletal: No chest wall abnormality. No acute or significant osseous findings. Review of the MIP images confirms the above findings. CTA ABDOMEN AND PELVIS FINDINGS VASCULAR Aorta: Normal caliber aorta without aneurysm, dissection, vasculitis or significant stenosis. Celiac: Patent without evidence of aneurysm, dissection, vasculitis or significant stenosis. SMA: Patent without evidence of aneurysm, dissection, vasculitis or significant stenosis. Renals: Both renal arteries are patent without evidence of aneurysm, dissection, vasculitis, fibromuscular dysplasia or significant stenosis. IMA: Patent without evidence of aneurysm, dissection, vasculitis or significant stenosis. Inflow: Patent without evidence of aneurysm, dissection, vasculitis or significant stenosis. Veins: No obvious venous abnormality within the limitations of this arterial phase study. Review of the MIP images confirms the above findings. NON-VASCULAR Hepatobiliary: No focal liver abnormality is seen. No gallstones, gallbladder wall thickening, or biliary  dilatation. Pancreas: Unremarkable. No pancreatic ductal dilatation or surrounding inflammatory changes. Spleen: Normal in size without focal abnormality. Adrenals/Urinary Tract: Adrenal glands are unremarkable. Kidneys are normal, without renal calculi, focal lesion, or hydronephrosis. Bladder is unremarkable. Stomach/Bowel: Stomach is within normal limits. Appendix appears  normal. No evidence of bowel wall thickening, distention, or inflammatory changes. Lymphatic: No adenopathy. Reproductive: Uterus and bilateral adnexa are unremarkable. Other: No abdominal wall hernia or abnormality. No abdominopelvic ascites. Musculoskeletal: No acute or significant osseous findings. Review of the MIP images confirms the above findings. IMPRESSION: 1. No evidence of thoracic or abdominal aortic dissection or aneurysm. 2. Multiple small rounded lucencies are noted throughout both lungs most predominantly seen in the upper lobes. 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 3. Mildly enlarged bilateral axillary adenopathy is noted which most likely is reactive or inflammatory in etiology. 4. No acute abnormality seen in the abdomen or pelvis. Emphysema (ICD10-J43.9). Electronically Signed   By: Lynwood Landy Raddle M.D.   On: 10/10/2024 13:30   DG Chest 2 View Result Date: 10/10/2024 CLINICAL DATA:  Chest pain, shortness of breath EXAM: CHEST - 2 VIEW COMPARISON:  None Available. FINDINGS: The heart size and mediastinal contours are within normal limits. Both lungs are clear. The visualized skeletal structures are unremarkable. IMPRESSION: No active cardiopulmonary disease. Electronically Signed   By: Lynwood Landy Raddle M.D.   On: 10/10/2024 10:53    Pertinent labs & imaging results that were available during my care of the patient were reviewed by me and considered in my medical decision making (see MDM for details).  Medications Ordered in ED Medications  aspirin  chewable tablet  324 mg (has no administration in time range)  nitroGLYCERIN  (NITROSTAT ) SL tablet 0.4 mg (has no administration in time range)  iohexol  (OMNIPAQUE ) 350 MG/ML injection 100 mL (100 mLs Intravenous Contrast Given 10/10/24 1219)                                                                                                                                     Procedures .Critical Care  Performed by: Mannie Fairy DASEN, DO Authorized by: Mannie Fairy DASEN, DO   Critical care provider statement:    Critical care time (minutes):  30   Critical care was necessary to treat or prevent imminent or life-threatening deterioration of the following conditions:  Cardiac failure   Critical care was time spent personally by me on the following activities:  Development of treatment plan with patient or surrogate, discussions with consultants, evaluation of patient's response to treatment, examination of patient, ordering and review of laboratory studies, ordering and review of radiographic studies, ordering and performing treatments and interventions, pulse oximetry, re-evaluation of patient's condition and review of old charts   (including critical care time)  Medical Decision Making / ED Course   This patient presents to the ED for concern of chest pain, this involves an extensive number of treatment options, and is a complaint that carries with it a high risk of complications and morbidity.  The differential diagnosis includes ACS, pulmonary embolism, consider dissection, less likely pneumonia.  MDM: Patient's initial EKG did show some nonspecific ST changes compared  to prior.  Did not meet STEMI criteria.  Evaluated the patient, overall looked quite well.  Performed bedside ultrasound which did not show any wall motion abnormalities.  Patient's initial troponin went from 18-22.  Have ordered a repeat 4-hour troponin.  Obtain CT angio to assess for dissection which showed either a lobar emphysema or possible  Langerhan's histocytosis.  I think histocytosis is unlikely but will provide her with oncology follow-up pending the remainder of her workup.  Reassessment 3:10 PM-patient's troponin steadily increased slightly.  She continued to have some pain.  Reached out to cardiology and spoke with Dr. Raford who did recommend admission starting patient on heparin  drip.  They will consult on the patient.  Will admit to hospitalist.  Additional history obtained: -Additional history obtained from mother -External records from outside source obtained and reviewed including: Chart review including previous notes, labs, imaging, consultation notes   Lab Tests: -I ordered, reviewed, and interpreted labs.   The pertinent results include:   Labs Reviewed  BASIC METABOLIC PANEL WITH GFR - Abnormal; Notable for the following components:      Result Value   Glucose, Bld 109 (*)    All other components within normal limits  TROPONIN T, HIGH SENSITIVITY - Abnormal; Notable for the following components:   Troponin T High Sensitivity 22 (*)    All other components within normal limits  TROPONIN T, HIGH SENSITIVITY - Abnormal; Notable for the following components:   Troponin T High Sensitivity 39 (*)    All other components within normal limits  CBC  HCG, SERUM, QUALITATIVE  D-DIMER, QUANTITATIVE  TROPONIN T, HIGH SENSITIVITY  TROPONIN T, HIGH SENSITIVITY      EKG sinus rhythm, ST segment elevations without reciprocal changes.  EKG Interpretation Date/Time:  Friday October 10 2024 13:43:04 EST Ventricular Rate:  79 PR Interval:  171 QRS Duration:  94 QT Interval:  377 QTC Calculation: 433 R Axis:   9  Text Interpretation: Sinus rhythm Low voltage, precordial leads Confirmed by Mannie Pac [phone]) on 10/10/2024 2:12:23 PM         Imaging Studies ordered: I ordered imaging studies including chest x-ray, CTA chest I independently visualized and interpreted imaging. I agree with the  radiologist interpretation   Medicines ordered and prescription drug management: Meds ordered this encounter  Medications   iohexol  (OMNIPAQUE ) 350 MG/ML injection 100 mL   aspirin  chewable tablet 324 mg   nitroGLYCERIN  (NITROSTAT ) SL tablet 0.4 mg    -I have reviewed the patients home medicines and have made adjustments as needed  Critical interventions Management of possible NSTEMI    Cardiac Monitoring: The patient was maintained on a cardiac monitor.  I personally viewed and interpreted the cardiac monitored which showed an underlying rhythm of: Normal sinus rhythm  Social Determinants of Health:  Factors impacting patients care include: Lack of access to primary care   Reevaluation: After the interventions noted above, I reevaluated the patient and found that they have :improved  Co morbidities that complicate the patient evaluation  Past Medical History:  Diagnosis Date   Heart murmur      Final Clinical Impression(s) / ED Diagnoses Final diagnoses:  Chest pain, unspecified type  NSTEMI (non-ST elevated myocardial infarction) (HCC)     @PCDICTATION @     [1]  Social History Tobacco Use   Smoking status: Every Day    Current packs/day: 0.50    Types: Cigarettes   Smokeless tobacco: Never  Vaping Use   Vaping status:  Never Used  Substance Use Topics   Alcohol use: Yes    Comment: weekly   Drug use: Yes    Types: Marijuana     Mannie Pac T, DO 10/10/24 1516  "

## 2024-10-10 NOTE — ED Triage Notes (Signed)
 Pt reports woke at 0830 with centralized chest pain and SOB with nausea and sweating, pt c/o upper body pain in arms bilateral that is cause pressure, denies hx of the same , pt endorses heart murmur , and reports father with multiple MI's. Pain is currently 7/10

## 2024-10-10 NOTE — H&P (Addendum)
 " History and Physical    Patient: Sabrina Haynes FMW:[id number] DOB: 1985-12-28 DOA: 10/10/2024 DOS: the patient was seen and examined on 10/10/2024 PCP: System, Provider Not In  Patient coming from: Home  Chief Complaint:  Chief Complaint  Patient presents with   Chest Pain   Shortness of Breath   HPI: Sabrina Haynes is a 39 y.o. female with medical history significant of PTSD, tobacco use, vitamin D  deficiency who presented to the emergency department with complaints of precordial chest pain associated with nausea, dyspnea and diaphoresis since 0830 this morning.  Pain was radiating to her arms bilaterally.  She has been having minor pain since about a month. She has a history of cigarette smoking since she was a teenager. She has significant family history as her father has had a CABG and multiple myocardial infarctions. No palpitations, PND, orthopnea or pitting edema of the lower extremities. She denied fever, chills, rhinorrhea, sore throat, wheezing or hemoptysis.  No abdominal pain, nausea, emesis, diarrhea, constipation, melena or hematochezia.  No flank pain, dysuria, frequency or hematuria. No polyuria, polydipsia, polyphagia or blurred vision.   Lab work: CBC, D-dimer and serum pregnancy test were negative.  BMP was unremarkable with the exception of a glucose of 109 mg/dL.  Troponin was 18, then 22, then 39 and then 80 ng/L.  Imaging: 2 view chest radiograph with no active cardiopulmonary disease.  CTA chest no evidence of PE.  No evidence of dissection.  Multiple small rounded lucencies are noted throughout both lungs most predominantly seen in the upper lobes.  This is concerning for Langerhans cell histiocytosis or early central lobar emphysema.  Mildly enlarged bilateral axillary adenopathy which most likely is reactive or inflammatory in etiology.  No acute abnormality seen in the abdomen or pelvis.  ED course: Initial vital signs were temperature 98 F, pulse 96, respiration 18, BP  152/111 mmHg and O2 sat 100% on room air.  The patient received aspirin  324 mg p.o. x 1 dose, heparin  4000 units IV followed by a continuous heparin  infusion.  The case was discussed by the EDP with cardiology (Dr. Raford) and the patient will be transferred to [facility].   Review of Systems: As mentioned in the history of present illness. All other systems reviewed and are negative. Past Medical History:  Diagnosis Date   Heart murmur    No past surgical history on file. Social History:  reports that she has been smoking cigarettes. She has never used smokeless tobacco. She reports current alcohol use. She reports current drug use. Drug: Marijuana.  Allergies[1]  No family history on file.  Prior to Admission medications  Medication Sig Start Date End Date Taking? Authorizing Provider  hydrOXYzine  (ATARAX ) 10 MG tablet Take 10 mg by mouth daily as needed for anxiety. 09/03/24  Yes [provider]  metroNIDAZOLE (FLAGYL) 500 MG tablet Take 500 mg by mouth. Patient not taking: Reported on 10/10/2024 09/23/24   [provider]  valACYclovir (VALTREX) 500 MG tablet Take by mouth 2 (two) times daily. Patient not taking: Reported on 10/10/2024 09/23/24   [provider]    Physical Exam: Vitals:   10/10/24 0956 10/10/24 1000 10/10/24 1307 10/10/24 1533  BP: (!) 152/111 (!) 160/117 (!) 154/113   Pulse: 96 89 86   Resp: 18 18 17    Temp: 98 F (36.7 C)  98.3 F (36.8 C)   TempSrc: Oral  Oral   SpO2: 100% 100% 99%   Weight:    75.8  kg   Physical Exam Vitals and nursing note reviewed.  Constitutional:      General: She is awake. She is not in acute distress.    Appearance: Normal appearance. She is ill-appearing.  HENT:     Head: Normocephalic.     Nose: No rhinorrhea.     Mouth/Throat:     Mouth: Mucous membranes are moist.  Eyes:     General: No scleral icterus.    Pupils: Pupils are equal, round, and reactive to light.  Neck:     Vascular:  No JVD.  Cardiovascular:     Rate and Rhythm: Normal rate and regular rhythm.     Heart sounds: S1 normal and S2 normal.  Pulmonary:     Effort: Pulmonary effort is normal. No accessory muscle usage or prolonged expiration.     Breath sounds: Wheezing present. No rhonchi or rales.  Abdominal:     General: Bowel sounds are normal. There is no distension.     Palpations: Abdomen is soft.     Tenderness: There is no abdominal tenderness. There is no right CVA tenderness or left CVA tenderness.  Musculoskeletal:     Cervical back: Neck supple.     Right lower leg: No edema.     Left lower leg: No edema.  Skin:    General: Skin is warm and dry.  Neurological:     General: No focal deficit present.     Mental Status: She is alert and oriented to person, place, and time.  Psychiatric:        Mood and Affect: Mood normal.        Behavior: Behavior normal. Behavior is cooperative.     Data Reviewed:  Results are pending, will review when available.  EKG: Vent. rate 99 BPM PR interval 174 ms QRS duration 75 ms QT/QTcB 354/455 ms P-R-T axes 64 34 79 Sinus rhythm Probable left atrial enlargement Probable RV involvement, suggest recording right precordial leads Baseline wander in lead(s) II III aVF Non specific ST changes without reciprocal depression  Assessment and Plan: Principal Problem:   Precordial pain   Abnormal EKG   Elevated troponin In the setting of:   NSTEMI (non-ST elevated myocardial infarction) (HCC) Observation/PCU. Supplemental oxygen as needed. Continue heparin  infusion. Continue daily aspirin . Begin low-dose metoprolol  twice daily. Sublingual NTG as needed. Trend troponin level. Serial EKG. Obtain echocardiogram. Cardiology will evaluate echo.  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 May use DuoNeb as needed. PCCM will follow in consult at [facility].    Tobacco use Tobacco cessation  advised. Nicotine  replacement therapy ordered.    Vitamin D  deficiency Recommend the regular vitamin D  supplementation.      Marijuana use Cessation or at least moderation advised. Cannabis may cause reflex tachycardia    PTSD (post-traumatic stress disorder) Currently not on medical therapy.      Advance Care Planning:   Code Status: Full Code   Consults: Cardiology   Family Communication:   Severity of Illness: The appropriate patient status for this patient is INPATIENT. Inpatient status is judged to be reasonable and necessary in order to provide the required intensity of service to ensure the patient's safety. The patient's presenting symptoms, physical exam findings, and initial radiographic and laboratory data in the context of their chronic comorbidities is felt to place them at high risk for further clinical deterioration. Furthermore, it is not anticipated that the patient will be medically stable for  discharge from the hospital within 2 midnights of admission.   * I certify that at the point of admission it is my clinical judgment that the patient will require inpatient hospital care spanning beyond 2 midnights from the point of admission due to high intensity of service, high risk for further deterioration and high frequency of surveillance required.*  Author: Alm Dorn Castor, MD 10/10/2024 3:37 PM  For on call review [email or web address].   This document was prepared using Dragon voice recognition software and may contain some unintended transcription errors.     [1]  Allergies Allergen Reactions   Latex Swelling   "

## 2024-10-10 NOTE — Progress Notes (Signed)
 [facility] night cross cover note:    I have ordered a nicotine  patch for the patient, and have resumed her home prn Atarax  for anxiety.   Eva Pore, DO Hospitalist

## 2024-10-10 NOTE — Progress Notes (Signed)
 PHARMACY - ANTICOAGULATION CONSULT NOTE  Pharmacy Consult for IV Heparin  Indication: NSTEMI  Allergies[1]  Patient Measurements:    Vital Signs: Temp: 98.3 F (36.8 C) (02/06 1307) Temp Source: Oral (02/06 1307) BP: 154/113 (02/06 1307) Pulse Rate: 86 (02/06 1307)  Labs: Recent Labs    10/10/24 1055  HGB 14.3  HCT 42.9  PLT 354  CREATININE 0.75    CrCl cannot be calculated (Unknown ideal weight.).   Medical History: Past Medical History:  Diagnosis Date   Heart murmur     Medications:  No prior to admission anticoagulant medications listed.  Assessment: Pharmacy consulted to manage IV heparin  for NSTEMI.  Presented to ED with chest pain.  Increased troponin and continued chest pain.  Cards who are consulted recommended admission and starting patient on heparin  drip.    CBC: Hgb and platelets WNL  Goal of Therapy:  Heparin  level 0.3-0.7 units/ml Monitor platelets by anticoagulation protocol: Yes   Plan:  Baseline aPTT and PT/INR Initiate IV heparin  with a 4000 unit heparin  bolus via infusion then 900 units/hr continuous infusion 6 hour heparin  level for titration Monitor daily heparin  level, CBC, signs/symptoms of bleeding    Thank you for allowing pharmacy to be a part of this patients care.  Eleanor EMERSON Agent, PharmD, BCPS Clinical Pharmacist [place] 10/10/2024 3:23 PM       [1]  Allergies Allergen Reactions   Latex Swelling
